# Patient Record
Sex: Female | Born: 1980 | Race: White | Hispanic: No | Marital: Married | Smoking: Current every day smoker
Health system: Southern US, Community
[De-identification: ages and names within clinical notes are randomized; demographics above are authoritative.]

## PROBLEM LIST (undated history)

## (undated) ENCOUNTER — Inpatient Hospital Stay (HOSPITAL_COMMUNITY): Payer: Self-pay

## (undated) DIAGNOSIS — Z9889 Other specified postprocedural states: Secondary | ICD-10-CM

## (undated) DIAGNOSIS — F329 Major depressive disorder, single episode, unspecified: Secondary | ICD-10-CM

## (undated) DIAGNOSIS — T4145XA Adverse effect of unspecified anesthetic, initial encounter: Secondary | ICD-10-CM

## (undated) DIAGNOSIS — M1990 Unspecified osteoarthritis, unspecified site: Secondary | ICD-10-CM

## (undated) DIAGNOSIS — R519 Headache, unspecified: Secondary | ICD-10-CM

## (undated) DIAGNOSIS — IMO0002 Reserved for concepts with insufficient information to code with codable children: Secondary | ICD-10-CM

## (undated) DIAGNOSIS — G8929 Other chronic pain: Secondary | ICD-10-CM

## (undated) DIAGNOSIS — R112 Nausea with vomiting, unspecified: Secondary | ICD-10-CM

## (undated) DIAGNOSIS — R51 Headache: Secondary | ICD-10-CM

## (undated) DIAGNOSIS — Z8619 Personal history of other infectious and parasitic diseases: Secondary | ICD-10-CM

## (undated) DIAGNOSIS — G5601 Carpal tunnel syndrome, right upper limb: Secondary | ICD-10-CM

## (undated) DIAGNOSIS — F32A Depression, unspecified: Secondary | ICD-10-CM

## (undated) DIAGNOSIS — T8859XA Other complications of anesthesia, initial encounter: Secondary | ICD-10-CM

## (undated) DIAGNOSIS — M549 Dorsalgia, unspecified: Secondary | ICD-10-CM

## (undated) DIAGNOSIS — F419 Anxiety disorder, unspecified: Secondary | ICD-10-CM

## (undated) DIAGNOSIS — R768 Other specified abnormal immunological findings in serum: Secondary | ICD-10-CM

## (undated) DIAGNOSIS — K219 Gastro-esophageal reflux disease without esophagitis: Secondary | ICD-10-CM

## (undated) DIAGNOSIS — R0600 Dyspnea, unspecified: Secondary | ICD-10-CM

## (undated) DIAGNOSIS — N3090 Cystitis, unspecified without hematuria: Secondary | ICD-10-CM

## (undated) DIAGNOSIS — M797 Fibromyalgia: Secondary | ICD-10-CM

## (undated) DIAGNOSIS — Z8739 Personal history of other diseases of the musculoskeletal system and connective tissue: Secondary | ICD-10-CM

## (undated) HISTORY — PX: WISDOM TOOTH EXTRACTION: SHX21

## (undated) HISTORY — DX: Personal history of other infectious and parasitic diseases: Z86.19

## (undated) HISTORY — DX: Adverse effect of unspecified anesthetic, initial encounter: T41.45XA

## (undated) HISTORY — DX: Other complications of anesthesia, initial encounter: T88.59XA

## (undated) HISTORY — DX: Reserved for concepts with insufficient information to code with codable children: IMO0002

## (undated) HISTORY — DX: Carpal tunnel syndrome, right upper limb: G56.01

## (undated) HISTORY — DX: Personal history of other diseases of the musculoskeletal system and connective tissue: Z87.39

---
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
## 2002-09-11 ENCOUNTER — Other Ambulatory Visit: Admission: RE | Admit: 2002-09-11 | Discharge: 2002-09-11 | Payer: Self-pay | Admitting: Obstetrics and Gynecology

## 2003-09-09 ENCOUNTER — Other Ambulatory Visit: Admission: RE | Admit: 2003-09-09 | Discharge: 2003-09-09 | Payer: Self-pay | Admitting: Obstetrics and Gynecology

## 2011-01-05 LAB — RPR: RPR: NONREACTIVE

## 2011-01-05 LAB — HIV ANTIBODY (ROUTINE TESTING W REFLEX): HIV: NONREACTIVE

## 2011-01-27 LAB — GC/CHLAMYDIA PROBE AMP, GENITAL
Chlamydia: NEGATIVE
Gonorrhea: NEGATIVE

## 2011-06-09 ENCOUNTER — Inpatient Hospital Stay (HOSPITAL_COMMUNITY): Payer: Federal, State, Local not specified - PPO

## 2011-06-09 ENCOUNTER — Encounter (HOSPITAL_COMMUNITY): Payer: Self-pay

## 2011-06-09 ENCOUNTER — Inpatient Hospital Stay (HOSPITAL_COMMUNITY)
Admission: AD | Admit: 2011-06-09 | Discharge: 2011-06-09 | Disposition: A | Discharge status: Home / Self Care | Payer: Federal, State, Local not specified - PPO | Source: Ambulatory Visit | Attending: Obstetrics and Gynecology | Admitting: Obstetrics and Gynecology

## 2011-06-09 DIAGNOSIS — K8020 Calculus of gallbladder without cholecystitis without obstruction: Secondary | ICD-10-CM

## 2011-06-09 DIAGNOSIS — R1011 Right upper quadrant pain: Secondary | ICD-10-CM | POA: Insufficient documentation

## 2011-06-09 DIAGNOSIS — O9989 Other specified diseases and conditions complicating pregnancy, childbirth and the puerperium: Secondary | ICD-10-CM | POA: Insufficient documentation

## 2011-06-09 HISTORY — DX: Anxiety disorder, unspecified: F41.9

## 2011-06-09 LAB — URINE MICROSCOPIC-ADD ON

## 2011-06-09 LAB — COMPREHENSIVE METABOLIC PANEL
ALT: 12 U/L (ref 0–35)
AST: 22 U/L (ref 0–37)
Albumin: 2.8 g/dL — ABNORMAL LOW (ref 3.5–5.2)
Alkaline Phosphatase: 114 U/L (ref 39–117)
BUN: 6 mg/dL (ref 6–23)
CO2: 24 mEq/L (ref 19–32)
Calcium: 9.1 mg/dL (ref 8.4–10.5)
Chloride: 99 mEq/L (ref 96–112)
Creatinine, Ser: 0.61 mg/dL (ref 0.50–1.10)
GFR calc Af Amer: >90 mL/min (ref >90)
GFR calc non Af Amer: >90 mL/min (ref >90)
Glucose, Bld: 83 mg/dL (ref 70–99)
Potassium: 3.6 mEq/L (ref 3.5–5.1)
Sodium: 133 mEq/L — ABNORMAL LOW (ref 135–145)
Total Bilirubin: 0.4 mg/dL (ref 0.3–1.2)
Total Protein: 6.5 g/dL (ref 6.0–8.3)

## 2011-06-09 LAB — URINALYSIS, ROUTINE W REFLEX MICROSCOPIC
Bilirubin Urine: NEGATIVE
Glucose, UA: NEGATIVE mg/dL
Hgb urine dipstick: NEGATIVE
Ketones, ur: NEGATIVE mg/dL
Nitrite: NEGATIVE
Protein, ur: NEGATIVE mg/dL
Specific Gravity, Urine: 1.015 (ref 1.005–1.030)
Urobilinogen, UA: 0.2 mg/dL (ref 0.0–1.0)
pH: 7.5 (ref 5.0–8.0)

## 2011-06-09 LAB — CBC
HCT: 33.3 % — ABNORMAL LOW (ref 36.0–46.0)
Hemoglobin: 10.9 g/dL — ABNORMAL LOW (ref 12.0–15.0)
MCH: 30.3 pg (ref 26.0–34.0)
MCHC: 32.7 g/dL (ref 30.0–36.0)
MCV: 92.5 fL (ref 78.0–100.0)
Platelets: 250 10*3/uL (ref 150–400)
RBC: 3.6 MIL/uL — ABNORMAL LOW (ref 3.87–5.11)
RDW: 12.5 % (ref 11.5–15.5)
WBC: 9.2 10*3/uL (ref 4.0–10.5)

## 2011-06-09 LAB — LIPASE, BLOOD: Lipase: 25 U/L (ref 11–59)

## 2011-06-09 LAB — AMYLASE: Amylase: 78 U/L (ref 0–105)

## 2011-06-09 MED ORDER — LACTATED RINGERS IV SOLN
INTRAVENOUS | Status: DC
  Administered 2011-06-09: 20:00:00 via INTRAVENOUS

## 2011-06-09 MED ORDER — PROMETHAZINE HCL 25 MG/ML IJ SOLN
12.5000 mg | INTRAMUSCULAR | Status: AC
  Administered 2011-06-09: 12.5 mg via INTRAVENOUS
  Filled 2011-06-09: qty 1

## 2011-06-09 MED ORDER — HYDROMORPHONE HCL 4 MG PO TABS: 4.0000 mg | ORAL_TABLET | ORAL | Status: AC | PRN

## 2011-06-09 MED ORDER — BUTORPHANOL TARTRATE 2 MG/ML IJ SOLN
1.0000 mg | INTRAMUSCULAR | Status: AC
  Administered 2011-06-09: 1 mg via INTRAVENOUS
  Filled 2011-06-09: qty 1

## 2011-06-09 NOTE — Progress Notes (Signed)
Pt reports constant sharp right upper quadrant pain since 3pm today and lower abdominal pressure. Denies vaginal bleeding. Reports positive fetal movement. Took medicine for headache this afternoon and headache has gotten better. Report intermittent blurry vision but no spots in vision.

## 2011-06-09 NOTE — Discharge Instructions (Signed)
Gallstones Gallstones are a form of gallbladder disease. The gallbladder is a small organ that helps you digest food.  HOME CARE  Only take medicine as told by your doctor.   Eat a low-fat diet.   Follow up as told.  GET HELP RIGHT AWAY IF:   Your pain gets worse.   You develop yellow skin or eyes (jaundice).   The pain moves to another part of your belly (abdomen) or back.   You have a temperature by mouth above 102 F (38.9 C), not controlled by medicine.   You feel sick to your stomach (nauseous) and throw up (vomit).  MAKE SURE YOU:   Understand these instructions.   Will watch your condition.   Will get help right away if you are not doing well or get worse.  Document Released: 09/07/2007 Document Revised: 03/10/2011 Document Reviewed: 02/24/2009 [employer] Patient Information 2012 [employer]. 

## 2011-06-09 NOTE — ED Provider Notes (Signed)
Chief Complaint:  RUQ pain  HPI  Cynthia Brooks is  31 y.o. [id number] at [redacted]w[redacted]d presents with RUQ pain starting today.  She reports some mild nausea r/t not eating since noon today but reports the pain is worsening since arrival in MAU.  She reports good fetal movement and denies LOF, vaginal bleeding, uterine contractions, dizziness, h/a, visual disturbances, urinary symptoms, or fever/chills.    Obstetrical/Gynecological History: OB History    Grav Para Term Preterm Abortions TAB SAB Ect Mult Living   4 2 2  0 1 0 1 0 0 2      Past Medical History: Past Medical History  Diagnosis Date  . Anxiety     Past Surgical History: Past Surgical History  Procedure Date  . Appendectomy 1989    Family History: Family History  Problem Relation Age of Onset  . Anesthesia problems Neg Hx     Social History: History  Substance Use Topics  . Smoking status: Never Smoker   . Smokeless tobacco: Not on file  . Alcohol Use: No    Allergies:  Allergies  Allergen Reactions  . Bupivacaine Other (See Comments)    Hypotension; with epidural  . Fentanyl Other (See Comments)    Hypotension; with epidural  . Percocet (Oxycodone-Acetaminophen) Nausea And Vomiting    Meds:  Prescriptions prior to admission  Medication Sig Dispense Refill  . citalopram (CELEXA) 20 MG tablet Take 20 mg by mouth daily.      Marland Kitchen HYDROcodone-acetaminophen (NORCO) 7.5-325 MG per tablet Take 1.5 tablets by mouth every 4 (four) hours as needed. Head aches      . Prenatal Vit-Fe Fumarate-FA (PRENATAL MULTIVITAMIN) TABS Take 1 tablet by mouth at bedtime.      . Simethicone (GAS-X PO) Take 1 tablet by mouth daily as needed. gas        Review of Systems See HPI   Physical Exam  Blood pressure 113/68, pulse 92, resp. rate 18. GENERAL: Well-developed, well-nourished female in no acute distress.  LUNGS: Clear to auscultation bilaterally.  HEART: Regular rate and rhythm. ABDOMEN: Soft, nontender, nondistended, gravid.    EXTREMITIES: Nontender, no edema, 2+ distal pulses. Pelvic exam deferred  Neg CVA tenderness Neg rebound tenderness Neg guarding Neg McBurney's Point tenderness  FHT:  Baseline rate 125 bpm   Variability moderate  Accelerations present   Decelerations none Contractions: None   Labs: Recent Results (from the past 24 hour(s))  URINALYSIS, ROUTINE W REFLEX MICROSCOPIC   Collection Time   06/09/11  6:14 PM      Component Value Range   Color, Urine YELLOW  YELLOW    APPearance HAZY (*) CLEAR    Specific Gravity, Urine 1.015  1.005 - 1.030    pH 7.5  5.0 - 8.0    Glucose, UA NEGATIVE  NEGATIVE (mg/dL)   Hgb urine dipstick NEGATIVE  NEGATIVE    Bilirubin Urine NEGATIVE  NEGATIVE    Ketones, ur NEGATIVE  NEGATIVE (mg/dL)   Protein, ur NEGATIVE  NEGATIVE (mg/dL)   Urobilinogen, UA 0.2  0.0 - 1.0 (mg/dL)   Nitrite NEGATIVE  NEGATIVE    Leukocytes, UA SMALL (*) NEGATIVE   URINE MICROSCOPIC-ADD ON   Collection Time   06/09/11  6:14 PM      Component Value Range   Squamous Epithelial / LPF MANY (*) RARE    WBC, UA 11-20  <3 (WBC/hpf)   Bacteria, UA FEW (*) RARE   COMPREHENSIVE METABOLIC PANEL   Collection Time  06/09/11  7:02 PM      Component Value Range   Sodium 133 (*) 135 - 145 (mEq/L)   Potassium 3.6  3.5 - 5.1 (mEq/L)   Chloride 99  96 - 112 (mEq/L)   CO2 24  19 - 32 (mEq/L)   Glucose, Bld 83  70 - 99 (mg/dL)   BUN 6  6 - 23 (mg/dL)   Creatinine, Ser 1.61  0.50 - 1.10 (mg/dL)   Calcium 9.1  8.4 - 09.6 (mg/dL)   Total Protein 6.5  6.0 - 8.3 (g/dL)   Albumin 2.8 (*) 3.5 - 5.2 (g/dL)   AST 22  0 - 37 (U/L)   ALT 12  0 - 35 (U/L)   Alkaline Phosphatase 114  39 - 117 (U/L)   Total Bilirubin 0.4  0.3 - 1.2 (mg/dL)   GFR calc non Af Amer >90  >90 (mL/min)   GFR calc Af Amer >90  >90 (mL/min)  CBC   Collection Time   06/09/11  7:02 PM      Component Value Range   WBC 9.2  4.0 - 10.5 (K/uL)   RBC 3.60 (*) 3.87 - 5.11 (MIL/uL)   Hemoglobin 10.9 (*) 12.0 - 15.0 (g/dL)   HCT  04.5 (*) 40.9 - 46.0 (%)   MCV 92.5  78.0 - 100.0 (fL)   MCH 30.3  26.0 - 34.0 (pg)   MCHC 32.7  30.0 - 36.0 (g/dL)   RDW 81.1  91.4 - 78.2 (%)   Platelets 250  150 - 400 (K/uL)   Imaging Studies:    Assessment/Plan: Called Dr Marcelle Overlie and reviewed pt presentation Plan for IV fluids, medications and abdominal and OB U/S  IV LR IV phenergan 12.5 mg IV Stadol 1 mg Abdominal U/S for RUQ pain OB limited U/S of placenta CMP, CBC, U/A, amylase, lipase  LEFTWICH-KIRBY, LISA 3/7/20138:11 PM  Care assumed by Henrietta Hoover, PA.   I have assumed care of this pt from Sharen Counter, CNM.  US Abdomen Complete  06/09/2011  *RADIOLOGY REPORT*  Clinical Data:  Abdominal pain.  COMPLETE ABDOMINAL ULTRASOUND  Comparison:  None.  Findings:  Gallbladder:  There is extensive sludge in the gallbladder and there appear to be a few tiny stones.  Negative sonographic Murphy's sign.  Gallbladder wall is not thickened.  Common bile duct:  Normal.  3.3 mm in diameter.  Liver:  Normal.  IVC:  Normal.  Pancreas:  Normal.  Spleen:  Normal.  8.0 cm in length.  Right Kidney:  Normal.  10.1 cm in length.  Left Kidney:  Normal.  9.8 cm in length.  Abdominal aorta:  No aneurysm identified.  IMPRESSION: Sludge and possible tiny stones in the gallbladder.  Otherwise normal exam.  Original Report Authenticated By: Gwynn Burly, M.D.    Discussed pt with Dr. Marcelle Overlie. Will give Rx for few dilaudid and have pt f/u in office within 1wk.  A/P: Cholelithiasis and sludge: discussed with pt at length. Discussed proper diet. Will give few dilaudid for prn pain. She will f/u with Dr. Dennie Bible office within 1wk. Discussed diet, activity, risks, and precautions.  Clinton Gallant. Omran Keelin III, DrHSc, MPAS, PA-C   Henrietta Hoover, PA 06/09/11 2051  Henrietta Hoover, [place] 06/09/11 2115

## 2011-06-10 ENCOUNTER — Inpatient Hospital Stay (HOSPITAL_COMMUNITY)
Admission: AD | Admit: 2011-06-10 | Discharge: 2011-06-10 | DRG: 886 | Disposition: A | Discharge status: Home / Self Care | Payer: Federal, State, Local not specified - PPO | Attending: Obstetrics and Gynecology | Admitting: Obstetrics and Gynecology

## 2011-06-10 ENCOUNTER — Encounter (HOSPITAL_COMMUNITY): Payer: Self-pay | Admitting: *Deleted

## 2011-06-10 DIAGNOSIS — O9989 Other specified diseases and conditions complicating pregnancy, childbirth and the puerperium: Principal | ICD-10-CM | POA: Diagnosis present

## 2011-06-10 DIAGNOSIS — K8020 Calculus of gallbladder without cholecystitis without obstruction: Secondary | ICD-10-CM | POA: Diagnosis present

## 2011-06-10 DIAGNOSIS — R1011 Right upper quadrant pain: Secondary | ICD-10-CM | POA: Diagnosis present

## 2011-06-10 HISTORY — DX: Other specified abnormal immunological findings in serum: R76.8

## 2011-06-10 LAB — COMPREHENSIVE METABOLIC PANEL
ALT: 20 U/L (ref 0–35)
AST: 36 U/L (ref 0–37)
Albumin: 2.5 g/dL — ABNORMAL LOW (ref 3.5–5.2)
Alkaline Phosphatase: 121 U/L — ABNORMAL HIGH (ref 39–117)
BUN: 6 mg/dL (ref 6–23)
CO2: 25 mEq/L (ref 19–32)
Calcium: 8.3 mg/dL — ABNORMAL LOW (ref 8.4–10.5)
Chloride: 104 mEq/L (ref 96–112)
Creatinine, Ser: 0.59 mg/dL (ref 0.50–1.10)
GFR calc Af Amer: >90 mL/min (ref >90)
GFR calc non Af Amer: >90 mL/min (ref >90)
Glucose, Bld: 96 mg/dL (ref 70–99)
Potassium: 4.1 mEq/L (ref 3.5–5.1)
Sodium: 136 mEq/L (ref 135–145)
Total Bilirubin: 1.1 mg/dL (ref 0.3–1.2)
Total Protein: 5.9 g/dL — ABNORMAL LOW (ref 6.0–8.3)

## 2011-06-10 LAB — CBC
HCT: 29.9 % — ABNORMAL LOW (ref 36.0–46.0)
Hemoglobin: 10 g/dL — ABNORMAL LOW (ref 12.0–15.0)
MCH: 31 pg (ref 26.0–34.0)
MCHC: 33.4 g/dL (ref 30.0–36.0)
MCV: 92.6 fL (ref 78.0–100.0)
Platelets: 221 10*3/uL (ref 150–400)
RBC: 3.23 MIL/uL — ABNORMAL LOW (ref 3.87–5.11)
RDW: 12.6 % (ref 11.5–15.5)
WBC: 7.4 10*3/uL (ref 4.0–10.5)

## 2011-06-10 LAB — HEPATITIS B SURFACE ANTIGEN: Hepatitis B Surface Ag: NEGATIVE

## 2011-06-10 LAB — ABO/RH: RH Type: POSITIVE

## 2011-06-10 LAB — ANTIBODY SCREEN: Antibody Screen: NEGATIVE

## 2011-06-10 LAB — HIV ANTIBODY (ROUTINE TESTING W REFLEX): HIV: NONREACTIVE

## 2011-06-10 LAB — RPR: RPR: NONREACTIVE

## 2011-06-10 LAB — RUBELLA ANTIBODY, IGM: Rubella: IMMUNE

## 2011-06-10 LAB — GC/CHLAMYDIA PROBE AMP, GENITAL
Chlamydia: NEGATIVE
Gonorrhea: NEGATIVE

## 2011-06-10 MED ORDER — ZOLPIDEM TARTRATE 10 MG PO TABS: 10.0000 mg | ORAL_TABLET | Freq: Every evening | ORAL | Status: DC | PRN

## 2011-06-10 MED ORDER — DOCUSATE SODIUM 100 MG PO CAPS
100.0000 mg | ORAL_CAPSULE | Freq: Every day | ORAL | Status: DC
  Administered 2011-06-10: 100 mg via ORAL
  Filled 2011-06-10: qty 1

## 2011-06-10 MED ORDER — CALCIUM CARBONATE ANTACID 500 MG PO CHEW: 2.0000 | CHEWABLE_TABLET | ORAL | Status: DC | PRN

## 2011-06-10 MED ORDER — ONDANSETRON HCL 4 MG/2ML IJ SOLN: 4.0000 mg | Freq: Four times a day (QID) | INTRAMUSCULAR | Status: DC | PRN

## 2011-06-10 MED ORDER — ACETAMINOPHEN 325 MG PO TABS: 650.0000 mg | ORAL_TABLET | ORAL | Status: DC | PRN

## 2011-06-10 MED ORDER — TRAMADOL HCL 50 MG PO TABS: 50.0000 mg | ORAL_TABLET | Freq: Three times a day (TID) | ORAL | Status: DC | PRN

## 2011-06-10 MED ORDER — DIPHENHYDRAMINE HCL 50 MG/ML IJ SOLN: 12.5000 mg | Freq: Four times a day (QID) | INTRAMUSCULAR | Status: DC | PRN

## 2011-06-10 MED ORDER — NALOXONE HCL 0.4 MG/ML IJ SOLN: 0.4000 mg | INTRAMUSCULAR | Status: DC | PRN

## 2011-06-10 MED ORDER — PRENATAL MULTIVITAMIN CH
1.0000 | ORAL_TABLET | Freq: Every day | ORAL | Status: DC
  Filled 2011-06-10: qty 1

## 2011-06-10 MED ORDER — HYDROCODONE-ACETAMINOPHEN 5-325 MG PO TABS: 1.0000 | ORAL_TABLET | ORAL | Status: DC | PRN

## 2011-06-10 MED ORDER — SODIUM CHLORIDE 0.9 % IJ SOLN: 9.0000 mL | INTRAMUSCULAR | Status: DC | PRN

## 2011-06-10 MED ORDER — LACTATED RINGERS IV SOLN
INTRAVENOUS | Status: DC
  Administered 2011-06-10: 01:00:00 via INTRAVENOUS

## 2011-06-10 MED ORDER — DIPHENHYDRAMINE HCL 12.5 MG/5ML PO ELIX
12.5000 mg | ORAL_SOLUTION | Freq: Four times a day (QID) | ORAL | Status: DC | PRN
  Filled 2011-06-10: qty 5

## 2011-06-10 MED ORDER — MORPHINE SULFATE (PF) 1 MG/ML IV SOLN
INTRAVENOUS | Status: DC
  Administered 2011-06-10: 02:00:00 via INTRAVENOUS
  Administered 2011-06-10: 4.5 mL via INTRAVENOUS
  Filled 2011-06-10: qty 25

## 2011-06-10 NOTE — Progress Notes (Signed)
Patient ID: Cynthia Brooks, female   DOB: 12/17/1980, 31 y.o.   MRN: [id number] Pt has tolerated oral today without problems VSSAF FHR +  DC home with bland diet to fu in office as scheduled DL

## 2011-06-10 NOTE — Progress Notes (Signed)
  Nutrition Dx: Food and nutrition-related knowledge deficit r/t no previous education aeb recently diagnosed with gallstones.   Nutrition education consult for Fat modified Diet completed.   Handout given to patient.  5 days of low fat menus given to pt. Concepts of diet for gall stones reviewed.  Questions answered.  Patient verbalizes understanding.  Fat modiifed diet may not completely eliminate symptoms of gallstones. Pain can be triggered by eating foods not high in fat.  Have counseled pt to also eliminate foods that are gas producing, spicy and have strong flavors. Even if patient follows all of these modifications, she may still experience episodes of pain.

## 2011-06-10 NOTE — Progress Notes (Signed)
Pt complains of nausea after eating; refuses med accepted gingerale

## 2011-06-10 NOTE — Discharge Instructions (Signed)
Gallstones Gallstones are a form of gallbladder disease. The gallbladder is a small organ that helps you digest food.  HOME CARE  Only take medicine as told by your doctor.   Eat a low-fat diet.   Follow up as told.  GET HELP RIGHT AWAY IF:   Your pain gets worse.   You develop yellow skin or eyes (jaundice).   The pain moves to another part of your belly (abdomen) or back.   You have a temperature by mouth above 102 F (38.9 C), not controlled by medicine.   You feel sick to your stomach (nauseous) and throw up (vomit).  MAKE SURE YOU:   Understand these instructions.   Will watch your condition.   Will get help right away if you are not doing well or get worse.  Document Released: 09/07/2007 Document Revised: 03/10/2011 Document Reviewed: 02/24/2009 [employer] Patient Information 2012 [employer]. 

## 2011-06-10 NOTE — Progress Notes (Signed)
UR Chart review completed.  

## 2011-06-10 NOTE — Progress Notes (Signed)
PT  HAS GALL STONES- WAS IN MAU   AT 930PM -  GAVE PAIN MED- ALSO GAVE RX-   TOOK HYDROMORPHONE  - 1 TAB.   PT VOMITED  AT 2330.     ATE   AT HOME AT 2200-  [place], CHEESE , BREAD

## 2011-06-10 NOTE — Progress Notes (Signed)
Pt reports no nausea or pain GFM VSSAF FHR 140s Abd Gravid nt  Biliary colic at 30 weeks Will get nutritional consult and advance diet Oral pain meds,  If does well, consider d/c DL

## 2011-06-10 NOTE — ED Provider Notes (Signed)
History   Pt presents today c/o worsening RUQ pain since being dc'd from the MAU earlier tonight. She was diagnosed with gallstones. When she went home, she ate a [place] and cheese sandwich and then her pain became worse. She denies lower abd pain, vag dc, bleeding, or any other sx at this time.  No chief complaint on file.  HPI  OB History    Grav Para Term Preterm Abortions TAB SAB Ect Mult Living   4 2 2  0 1 0 1 0 0 2      Past Medical History  Diagnosis Date  . Anxiety     Past Surgical History  Procedure Date  . Appendectomy 1989    Family History  Problem Relation Age of Onset  . Anesthesia problems Neg Hx     History  Substance Use Topics  . Smoking status: Never Smoker   . Smokeless tobacco: Not on file  . Alcohol Use: No    Allergies:  Allergies  Allergen Reactions  . Bupivacaine Other (See Comments)    Hypotension; with epidural  . Fentanyl Other (See Comments)    Hypotension; with epidural  . Percocet (Oxycodone-Acetaminophen) Nausea And Vomiting    Prescriptions prior to admission  Medication Sig Dispense Refill  . citalopram (CELEXA) 20 MG tablet Take 20 mg by mouth daily.      Marland Kitchen HYDROcodone-acetaminophen (NORCO) 7.5-325 MG per tablet Take 1.5 tablets by mouth every 4 (four) hours as needed. Head aches      . HYDROmorphone (DILAUDID) 4 MG tablet Take 1 tablet (4 mg total) by mouth every 4 (four) hours as needed for pain.  10 tablet  0  . Prenatal Vit-Fe Fumarate-FA (PRENATAL MULTIVITAMIN) TABS Take 1 tablet by mouth at bedtime.      . Simethicone (GAS-X PO) Take 1 tablet by mouth daily as needed. gas        Review of Systems  Constitutional: Negative for fever and chills.  Eyes: Negative for blurred vision and double vision.  Respiratory: Negative for cough, hemoptysis, sputum production, shortness of breath and wheezing.   Cardiovascular: Negative for chest pain and palpitations.  Gastrointestinal: Positive for nausea, vomiting and abdominal  pain. Negative for diarrhea and constipation.  Genitourinary: Negative for dysuria, urgency, frequency and hematuria.  Neurological: Negative for dizziness and headaches.  Psychiatric/Behavioral: Negative for depression and suicidal ideas.   Physical Exam   Blood pressure 102/69, pulse 101, temperature 97 F (36.1 C), temperature source Oral, resp. rate 20, height 5\' 4"  (1.626 m), weight 190 lb 8 oz (86.41 kg).  Physical Exam  Nursing note and vitals reviewed. Constitutional: She is oriented to person, place, and time. She appears well-developed and well-nourished. No distress.  HENT:  Head: Normocephalic and atraumatic.  Eyes: EOM are normal. Pupils are equal, round, and reactive to light.  GI: Soft. She exhibits no distension. There is tenderness. There is no rebound and no guarding.  Neurological: She is alert and oriented to person, place, and time.  Skin: Skin is warm and dry. She is not diaphoretic.  Psychiatric: She has a normal mood and affect. Her behavior is normal. Judgment and thought content normal.    MAU Course  Procedures  Results for orders placed during the hospital encounter of 06/09/11 (from the past 24 hour(s))  URINALYSIS, ROUTINE W REFLEX MICROSCOPIC     Status: Abnormal   Collection Time   06/09/11  6:14 PM      Component Value Range   Color, Urine  YELLOW  YELLOW    APPearance HAZY (*) CLEAR    Specific Gravity, Urine 1.015  1.005 - 1.030    pH 7.5  5.0 - 8.0    Glucose, UA NEGATIVE  NEGATIVE (mg/dL)   Hgb urine dipstick NEGATIVE  NEGATIVE    Bilirubin Urine NEGATIVE  NEGATIVE    Ketones, ur NEGATIVE  NEGATIVE (mg/dL)   Protein, ur NEGATIVE  NEGATIVE (mg/dL)   Urobilinogen, UA 0.2  0.0 - 1.0 (mg/dL)   Nitrite NEGATIVE  NEGATIVE    Leukocytes, UA SMALL (*) NEGATIVE   URINE MICROSCOPIC-ADD ON     Status: Abnormal   Collection Time   06/09/11  6:14 PM      Component Value Range   Squamous Epithelial / LPF MANY (*) RARE    WBC, UA 11-20  <3 (WBC/hpf)    Bacteria, UA FEW (*) RARE   COMPREHENSIVE METABOLIC PANEL     Status: Abnormal   Collection Time   06/09/11  7:02 PM      Component Value Range   Sodium 133 (*) 135 - 145 (mEq/L)   Potassium 3.6  3.5 - 5.1 (mEq/L)   Chloride 99  96 - 112 (mEq/L)   CO2 24  19 - 32 (mEq/L)   Glucose, Bld 83  70 - 99 (mg/dL)   BUN 6  6 - 23 (mg/dL)   Creatinine, Ser 1.61  0.50 - 1.10 (mg/dL)   Calcium 9.1  8.4 - 09.6 (mg/dL)   Total Protein 6.5  6.0 - 8.3 (g/dL)   Albumin 2.8 (*) 3.5 - 5.2 (g/dL)   AST 22  0 - 37 (U/L)   ALT 12  0 - 35 (U/L)   Alkaline Phosphatase 114  39 - 117 (U/L)   Total Bilirubin 0.4  0.3 - 1.2 (mg/dL)   GFR calc non Af Amer >90  >90 (mL/min)   GFR calc Af Amer >90  >90 (mL/min)  CBC     Status: Abnormal   Collection Time   06/09/11  7:02 PM      Component Value Range   WBC 9.2  4.0 - 10.5 (K/uL)   RBC 3.60 (*) 3.87 - 5.11 (MIL/uL)   Hemoglobin 10.9 (*) 12.0 - 15.0 (g/dL)   HCT 04.5 (*) 40.9 - 46.0 (%)   MCV 92.5  78.0 - 100.0 (fL)   MCH 30.3  26.0 - 34.0 (pg)   MCHC 32.7  30.0 - 36.0 (g/dL)   RDW 81.1  91.4 - 78.2 (%)   Platelets 250  150 - 400 (K/uL)  AMYLASE     Status: Normal   Collection Time   06/09/11  7:02 PM      Component Value Range   Amylase 78  0 - 105 (U/L)  LIPASE, BLOOD     Status: Normal   Collection Time   06/09/11  7:02 PM      Component Value Range   Lipase 25  11 - 59 (U/L)   US Abdomen Complete  06/09/2011  *RADIOLOGY REPORT*  Clinical Data:  Abdominal pain.  COMPLETE ABDOMINAL ULTRASOUND  Comparison:  None.  Findings:  Gallbladder:  There is extensive sludge in the gallbladder and there appear to be a few tiny stones.  Negative sonographic Murphy's sign.  Gallbladder wall is not thickened.  Common bile duct:  Normal.  3.3 mm in diameter.  Liver:  Normal.  IVC:  Normal.  Pancreas:  Normal.  Spleen:  Normal.  8.0 cm in length.  Right  Kidney:  Normal.  10.1 cm in length.  Left Kidney:  Normal.  9.8 cm in length.  Abdominal aorta:  No aneurysm identified.   IMPRESSION: Sludge and possible tiny stones in the gallbladder.  Otherwise normal exam.  Original Report Authenticated By: Gwynn Burly, M.D.   Discussed pt with Dr. Marcelle Overlie. Will admit for pain management.  Assessment and Plan  Admit for pain management.  Clinton Gallant. Aden Sek III, DrHSc, MPAS, PA-C  06/10/2011, 12:40 AM   Henrietta Hoover, PA 06/10/11 [phone]

## 2011-06-10 NOTE — Discharge Summary (Signed)
Physician Discharge Summary  Patient ID: Cynthia Brooks MRN: [id number] DOB/AGE: 1980-06-10 31 y.o.  Admit date: 06/10/2011 Discharge date: 06/10/2011  Admission Diagnoses:Pregnancy and biliary colic   Discharge Diagnoses: same Active Problems:  * No active hospital problems. *    Discharged Condition: good  Hospital Course: Admiited for IVF and pain control of biliary colic.  Pt responded to dietary changes and was discharged home on bland diet  Consults: None  Significant Diagnostic Studies: labs:   Treatments: IV hydration and analgesia: Vicodin and Dilaudid  Discharge Exam: Blood pressure 116/70, pulse 80, temperature 98.1 F (36.7 C), temperature source Oral, resp. rate 20, height 5\' 4"  (1.626 m), weight 86.183 kg (190 lb), SpO2 100.00%. General appearance: alert, cooperative and no distress  Disposition: 01-Home or Self Care  Discharge Orders    Future Orders Please Complete By Expires   Diet general      Scheduling Instructions:   Bland fat free diet as discussed   Increase activity slowly      Call MD for:  temperature >100.4      Call MD for:  persistant nausea and vomiting      Call MD for:  severe uncontrolled pain      Call MD for:  redness, tenderness, or signs of infection (pain, swelling, redness, odor or green/yellow discharge around incision site)      Call MD for:  difficulty breathing, headache or visual disturbances      HIV antibody      Comments:   This external order was created through the Results Console.   GC/chlamydia probe amp, genital      Comments:   This external order was created through the Results Console.   Rubella antibody, IgM      Comments:   This external order was created through the Results Console.   Hepatitis B surface antigen      Comments:   This external order was created through the Results Console.   RPR      Comments:   This external order was created through the Results Console.   HIV antibody      Comments:   This  external order was created through the Results Console.   GC/chlamydia probe amp, genital      Comments:   This external order was created through the Results Console.   RPR      Comments:   This external order was created through the Results Console.   Antibody screen      Comments:   This external order was created through the Results Console.   ABO/Rh      Comments:   This external order was created through the Results Console.     Medication List  As of 06/10/2011  4:00 PM   TAKE these medications         citalopram 20 MG tablet   Commonly known as: CELEXA   Take 20 mg by mouth daily.      GAS-X PO   Take 1 tablet by mouth daily as needed. gas      HYDROcodone-acetaminophen 7.5-325 MG per tablet   Commonly known as: NORCO   Take 1.5 tablets by mouth every 4 (four) hours as needed. Head aches      HYDROmorphone 4 MG tablet   Commonly known as: DILAUDID   Take 1 tablet (4 mg total) by mouth every 4 (four) hours as needed for pain.      prenatal multivitamin Tabs  Take 1 tablet by mouth at bedtime.             Signed: Treyveon Mochizuki C 06/10/2011, 4:00 PM

## 2011-07-26 ENCOUNTER — Encounter (HOSPITAL_COMMUNITY): Payer: Self-pay | Admitting: *Deleted

## 2011-07-26 ENCOUNTER — Inpatient Hospital Stay (HOSPITAL_COMMUNITY)
Admission: AD | Admit: 2011-07-26 | Discharge: 2011-07-26 | Disposition: A | Discharge status: Home / Self Care | Payer: Federal, State, Local not specified - PPO | Source: Ambulatory Visit | Attending: Obstetrics and Gynecology | Admitting: Obstetrics and Gynecology

## 2011-07-26 DIAGNOSIS — O4700 False labor before 37 completed weeks of gestation, unspecified trimester: Secondary | ICD-10-CM | POA: Insufficient documentation

## 2011-07-26 LAB — OB RESULTS CONSOLE GBS: GBS: NEGATIVE

## 2011-07-26 NOTE — MAU Note (Signed)
Pt states she has been having contractions every since about 1600.

## 2011-07-26 NOTE — Progress Notes (Signed)
Pt removed self from monitor. Pt informed prior that she needed to be monitor for another .

## 2011-07-26 NOTE — MAU Note (Signed)
Patient states she was 4/80 in the office this am. State contractions every 5 minutes. Denies any leaking or bleeding and reports good fetal movement.

## 2011-07-26 NOTE — Progress Notes (Signed)
Pt also states she has gall bladder problems

## 2011-07-26 NOTE — Discharge Instructions (Signed)
Pregnancy - Third Trimester The third trimester of pregnancy (the last 3 months) is a period of the most rapid growth for you and your baby. The baby approaches a length of 20 inches and a weight of 6 to 10 pounds. The baby is adding on fat and getting ready for life outside your body. While inside, babies have periods of sleeping and waking, suck their thumbs, and hiccups. You can often feel small contractions of the uterus. This is false labor. It is also called Braxton-Hicks contractions. This is like a practice for labor. The usual problems in this stage of pregnancy include more difficulty breathing, swelling of the hands and feet from water retention, and having to urinate more often because of the uterus and baby pressing on your bladder.  PRENATAL EXAMS  Blood work may continue to be done during prenatal exams. These tests are done to check on your health and the probable health of your baby. Blood work is used to follow your blood levels (hemoglobin). Anemia (low hemoglobin) is common during pregnancy. Iron and vitamins are given to help prevent this. You may also continue to be checked for diabetes. Some of the past blood tests may be done again.   The size of the uterus is measured during each visit. This makes sure your baby is growing properly according to your pregnancy dates.   Your blood pressure is checked every prenatal visit. This is to make sure you are not getting toxemia.   Your urine is checked every prenatal visit for infection, diabetes and protein.   Your weight is checked at each visit. This is done to make sure gains are happening at the suggested rate and that you and your baby are growing normally.   Sometimes, an ultrasound is performed to confirm the position and the proper growth and development of the baby. This is a test done that bounces harmless sound waves off the baby so your caregiver can more accurately determine due dates.   Discuss the type of pain  medication and anesthesia you will have during your labor and delivery.   Discuss the possibility and anesthesia if a Cesarean Section might be necessary.   Inform your caregiver if there is any mental or physical violence at home.  Sometimes, a specialized non-stress test, contraction stress test and biophysical profile are done to make sure the baby is not having a problem. Checking the amniotic fluid surrounding the baby is called an amniocentesis. The amniotic fluid is removed by sticking a needle into the belly (abdomen). This is sometimes done near the end of pregnancy if an early delivery is required. In this case, it is done to help make sure the baby's lungs are mature enough for the baby to live outside of the womb. If the lungs are not mature and it is unsafe to deliver the baby, an injection of cortisone medication is given to the mother 1 to 2 days before the delivery. This helps the baby's lungs mature and makes it safer to deliver the baby. CHANGES OCCURING IN THE THIRD TRIMESTER OF PREGNANCY Your body goes through many changes during pregnancy. They vary from person to person. Talk to your caregiver about changes you notice and are concerned about.  During the last trimester, you have probably had an increase in your appetite. It is normal to have cravings for certain foods. This varies from person to person and pregnancy to pregnancy.   You may begin to get stretch marks on your hips,   abdomen, and breasts. These are normal changes in the body during pregnancy. There are no exercises or medications to take which prevent this change.   Constipation may be treated with a stool softener or adding bulk to your diet. Drinking lots of fluids, fiber in vegetables, fruits, and whole grains are helpful.   Exercising is also helpful. If you have been very active up until your pregnancy, most of these activities can be continued during your pregnancy. If you have been less active, it is helpful  to start an exercise program such as walking. Consult your caregiver before starting exercise programs.   Avoid all smoking, alcohol, un-prescribed drugs, herbs and "street drugs" during your pregnancy. These chemicals affect the formation and growth of the baby. Avoid chemicals throughout the pregnancy to ensure the delivery of a healthy infant.   Backache, varicose veins and hemorrhoids may develop or get worse.   You will tire more easily in the third trimester, which is normal.   The baby's movements may be stronger and more often.   You may become short of breath easily.   Your belly button may stick out.   A yellow discharge may leak from your breasts called colostrum.   You may have a bloody mucus discharge. This usually occurs a few days to a week before labor begins.  HOME CARE INSTRUCTIONS   Keep your caregiver's appointments. Follow your caregiver's instructions regarding medication use, exercise, and diet.   During pregnancy, you are providing food for you and your baby. Continue to eat regular, well-balanced meals. Choose foods such as meat, fish, milk and other low fat dairy products, vegetables, fruits, and whole-grain breads and cereals. Your caregiver will tell you of the ideal weight gain.   A physical sexual relationship may be continued throughout pregnancy if there are no other problems such as early (premature) leaking of amniotic fluid from the membranes, vaginal bleeding, or belly (abdominal) pain.   Exercise regularly if there are no restrictions. Check with your caregiver if you are unsure of the safety of your exercises. Greater weight gain will occur in the last 2 trimesters of pregnancy. Exercising helps:   Control your weight.   Get you in shape for labor and delivery.   You lose weight after you deliver.   Rest a lot with legs elevated, or as needed for leg cramps or low back pain.   Wear a good support or jogging bra for breast tenderness during  pregnancy. This may help if worn during sleep. Pads or tissues may be used in the bra if you are leaking colostrum.   Do not use hot tubs, steam rooms, or saunas.   Wear your seat belt when driving. This protects you and your baby if you are in an accident.   Avoid raw meat, cat litter boxes and soil used by cats. These carry germs that can cause birth defects in the baby.   It is easier to loose urine during pregnancy. Tightening up and strengthening the pelvic muscles will help with this problem. You can practice stopping your urination while you are going to the bathroom. These are the same muscles you need to strengthen. It is also the muscles you would use if you were trying to stop from passing gas. You can practice tightening these muscles up 10 times a set and repeating this about 3 times per day. Once you know what muscles to tighten up, do not perform these exercises during urination. It is more likely   to cause an infection by backing up the urine.   Ask for help if you have financial, counseling or nutritional needs during pregnancy. Your caregiver will be able to offer counseling for these needs as well as refer you for other special needs.   Make a list of emergency phone numbers and have them available.   Plan on getting help from family or friends when you go home from the hospital.   Make a trial run to the hospital.   Take prenatal classes with the father to understand, practice and ask questions about the labor and delivery.   Prepare the baby's room/nursery.   Do not travel out of the city unless it is absolutely necessary and with the advice of your caregiver.   Wear only low or no heal shoes to have better balance and prevent falling.  MEDICATIONS AND DRUG USE IN PREGNANCY  Take prenatal vitamins as directed. The vitamin should contain 1 milligram of folic acid. Keep all vitamins out of reach of children. Only a couple vitamins or tablets containing iron may be fatal  to a baby or young child when ingested.   Avoid use of all medications, including herbs, over-the-counter medications, not prescribed or suggested by your caregiver. Only take over-the-counter or prescription medicines for pain, discomfort, or fever as directed by your caregiver. Do not use aspirin, ibuprofen (Motrin, Advil, Nuprin) or naproxen (Aleve) unless OK'd by your caregiver.   Let your caregiver also know about herbs you may be using.   Alcohol is related to a number of birth defects. This includes fetal alcohol syndrome. All alcohol, in any form, should be avoided completely. Smoking will cause low birth rate and premature babies.   Street/illegal drugs are very harmful to the baby. They are absolutely forbidden. A baby born to an addicted mother will be addicted at birth. The baby will go through the same withdrawal an adult does.  SEEK MEDICAL CARE IF: You have any concerns or worries during your pregnancy. It is better to call with your questions if you feel they cannot wait, rather than worry about them. DECISIONS ABOUT CIRCUMCISION You may or may not know the sex of your baby. If you know your baby is a boy, it may be time to think about circumcision. Circumcision is the removal of the foreskin of the penis. This is the skin that covers the sensitive end of the penis. There is no proven medical need for this. Often this decision is made on what is popular at the time or based upon religious beliefs and social issues. You can discuss these issues with your caregiver or pediatrician. SEEK IMMEDIATE MEDICAL CARE IF:   An unexplained oral temperature above 102 F (38.9 C) develops, or as your caregiver suggests.   You have leaking of fluid from the vagina (birth canal). If leaking membranes are suspected, take your temperature and tell your caregiver of this when you call.   There is vaginal spotting, bleeding or passing clots. Tell your caregiver of the amount and how many pads are  used.   You develop a bad smelling vaginal discharge with a change in the color from clear to white.   You develop vomiting that lasts more than 24 hours.   You develop chills or fever.   You develop shortness of breath.   You develop burning on urination.   You loose more than 2 pounds of weight or gain more than 2 pounds of weight or as suggested by your   caregiver.   You notice sudden swelling of your face, hands, and feet or legs.   You develop belly (abdominal) pain. Round ligament discomfort is a common non-cancerous (benign) cause of abdominal pain in pregnancy. Your caregiver still must evaluate you.   You develop a severe headache that does not go away.   You develop visual problems, blurred or double vision.   If you have not felt your baby move for more than 1 hour. If you think the baby is not moving as much as usual, eat something with sugar in it and lie down on your left side for an hour. The baby should move at least 4 to 5 times per hour. Call right away if your baby moves less than that.   You fall, are in a car accident or any kind of trauma.   There is mental or physical violence at home.   Increase fluid intake   Follow up with office in AM Document Released: 03/15/2001 Document Revised: 03/10/2011 Document Reviewed: 09/17/2008 [facility] Patient Information 2012 [place], [place].

## 2011-07-28 ENCOUNTER — Telehealth (HOSPITAL_COMMUNITY): Payer: Self-pay | Admitting: *Deleted

## 2011-07-28 ENCOUNTER — Encounter (HOSPITAL_COMMUNITY): Payer: Self-pay | Admitting: *Deleted

## 2011-07-28 NOTE — Telephone Encounter (Signed)
Preadmission screen  

## 2011-07-29 ENCOUNTER — Inpatient Hospital Stay (HOSPITAL_COMMUNITY): Admission: RE | Admit: 2011-07-29 | Payer: Federal, State, Local not specified - PPO | Source: Ambulatory Visit

## 2011-08-03 ENCOUNTER — Inpatient Hospital Stay (HOSPITAL_COMMUNITY)
Admission: RE | Admit: 2011-08-03 | Discharge: 2011-08-05 | DRG: 373 | Disposition: A | Discharge status: Home / Self Care | Payer: Federal, State, Local not specified - PPO | Source: Ambulatory Visit | Attending: Obstetrics and Gynecology | Admitting: Obstetrics and Gynecology

## 2011-08-03 ENCOUNTER — Encounter (HOSPITAL_COMMUNITY): Payer: Self-pay

## 2011-08-03 ENCOUNTER — Inpatient Hospital Stay (HOSPITAL_COMMUNITY): Payer: Federal, State, Local not specified - PPO | Admitting: Anesthesiology

## 2011-08-03 ENCOUNTER — Encounter (HOSPITAL_COMMUNITY): Payer: Self-pay | Admitting: Anesthesiology

## 2011-08-03 DIAGNOSIS — O26899 Other specified pregnancy related conditions, unspecified trimester: Secondary | ICD-10-CM | POA: Diagnosis present

## 2011-08-03 DIAGNOSIS — K8020 Calculus of gallbladder without cholecystitis without obstruction: Secondary | ICD-10-CM | POA: Diagnosis present

## 2011-08-03 LAB — CBC
HCT: 32.4 % — ABNORMAL LOW (ref 36.0–46.0)
Hemoglobin: 10.2 g/dL — ABNORMAL LOW (ref 12.0–15.0)
MCH: 28.1 pg (ref 26.0–34.0)
MCHC: 31.5 g/dL (ref 30.0–36.0)
MCV: 89.3 fL (ref 78.0–100.0)
Platelets: 222 10*3/uL (ref 150–400)
RBC: 3.63 MIL/uL — ABNORMAL LOW (ref 3.87–5.11)
RDW: 13.5 % (ref 11.5–15.5)
WBC: 5 10*3/uL (ref 4.0–10.5)

## 2011-08-03 LAB — RPR: RPR Ser Ql: NONREACTIVE

## 2011-08-03 MED ORDER — CITRIC ACID-SODIUM CITRATE 334-500 MG/5ML PO SOLN: 30.0000 mL | ORAL | Status: DC | PRN

## 2011-08-03 MED ORDER — DIPHENHYDRAMINE HCL 25 MG PO CAPS: 25.0000 mg | ORAL_CAPSULE | Freq: Four times a day (QID) | ORAL | Status: DC | PRN

## 2011-08-03 MED ORDER — ONDANSETRON HCL 4 MG/2ML IJ SOLN: 4.0000 mg | INTRAMUSCULAR | Status: DC | PRN

## 2011-08-03 MED ORDER — OXYTOCIN 20 UNITS IN LACTATED RINGERS INFUSION - SIMPLE
1.0000 m[IU]/min | INTRAVENOUS | Status: DC
  Administered 2011-08-03: 2 m[IU]/min via INTRAVENOUS
  Filled 2011-08-03: qty 1000

## 2011-08-03 MED ORDER — EPHEDRINE 5 MG/ML INJ
10.0000 mg | INTRAVENOUS | Status: DC | PRN
  Filled 2011-08-03: qty 4

## 2011-08-03 MED ORDER — LIDOCAINE HCL (PF) 1 % IJ SOLN
INTRAMUSCULAR | Status: DC | PRN
  Administered 2011-08-03 (×3): 4 mL

## 2011-08-03 MED ORDER — FENTANYL 2.5 MCG/ML BUPIVACAINE 1/10 % EPIDURAL INFUSION (WH - ANES)
14.0000 mL/h | INTRAMUSCULAR | Status: DC
  Administered 2011-08-03: 14 mL/h via EPIDURAL
  Filled 2011-08-03: qty 60

## 2011-08-03 MED ORDER — DIPHENHYDRAMINE HCL 50 MG/ML IJ SOLN: 12.5000 mg | INTRAMUSCULAR | Status: DC | PRN

## 2011-08-03 MED ORDER — ONDANSETRON HCL 4 MG PO TABS: 4.0000 mg | ORAL_TABLET | ORAL | Status: DC | PRN

## 2011-08-03 MED ORDER — EPHEDRINE 5 MG/ML INJ: 10.0000 mg | INTRAVENOUS | Status: DC | PRN

## 2011-08-03 MED ORDER — DIBUCAINE 1 % RE OINT: 1.0000 "application " | TOPICAL_OINTMENT | RECTAL | Status: DC | PRN

## 2011-08-03 MED ORDER — IBUPROFEN 600 MG PO TABS
600.0000 mg | ORAL_TABLET | Freq: Four times a day (QID) | ORAL | Status: DC
  Administered 2011-08-03 – 2011-08-05 (×6): 600 mg via ORAL
  Filled 2011-08-03 (×6): qty 1

## 2011-08-03 MED ORDER — WITCH HAZEL-GLYCERIN EX PADS: 1.0000 "application " | MEDICATED_PAD | CUTANEOUS | Status: DC | PRN

## 2011-08-03 MED ORDER — LACTATED RINGERS IV SOLN: 500.0000 mL | INTRAVENOUS | Status: DC | PRN

## 2011-08-03 MED ORDER — LANOLIN HYDROUS EX OINT: TOPICAL_OINTMENT | CUTANEOUS | Status: DC | PRN

## 2011-08-03 MED ORDER — MEDROXYPROGESTERONE ACETATE 150 MG/ML IM SUSP: 150.0000 mg | INTRAMUSCULAR | Status: DC | PRN

## 2011-08-03 MED ORDER — OXYTOCIN 20 UNITS IN LACTATED RINGERS INFUSION - SIMPLE: 125.0000 mL/h | Freq: Once | INTRAVENOUS | Status: DC

## 2011-08-03 MED ORDER — ONDANSETRON HCL 4 MG/2ML IJ SOLN: 4.0000 mg | Freq: Four times a day (QID) | INTRAMUSCULAR | Status: DC | PRN

## 2011-08-03 MED ORDER — TETANUS-DIPHTH-ACELL PERTUSSIS 5-2.5-18.5 LF-MCG/0.5 IM SUSP: 0.5000 mL | Freq: Once | INTRAMUSCULAR | Status: DC

## 2011-08-03 MED ORDER — CITALOPRAM HYDROBROMIDE 20 MG PO TABS
20.0000 mg | ORAL_TABLET | Freq: Every day | ORAL | Status: DC
  Administered 2011-08-03 – 2011-08-04 (×2): 20 mg via ORAL
  Filled 2011-08-03 (×2): qty 1

## 2011-08-03 MED ORDER — PHENYLEPHRINE 40 MCG/ML (10ML) SYRINGE FOR IV PUSH (FOR BLOOD PRESSURE SUPPORT)
80.0000 ug | PREFILLED_SYRINGE | INTRAVENOUS | Status: DC | PRN
  Filled 2011-08-03: qty 5

## 2011-08-03 MED ORDER — SENNOSIDES-DOCUSATE SODIUM 8.6-50 MG PO TABS
2.0000 | ORAL_TABLET | Freq: Every day | ORAL | Status: DC
  Administered 2011-08-03 – 2011-08-04 (×2): 2 via ORAL

## 2011-08-03 MED ORDER — LIDOCAINE HCL (PF) 1 % IJ SOLN: 30.0000 mL | INTRAMUSCULAR | Status: DC | PRN

## 2011-08-03 MED ORDER — SIMETHICONE 80 MG PO CHEW
80.0000 mg | CHEWABLE_TABLET | ORAL | Status: DC | PRN
  Administered 2011-08-04: 80 mg via ORAL

## 2011-08-03 MED ORDER — BENZOCAINE-MENTHOL 20-0.5 % EX AERO
1.0000 "application " | INHALATION_SPRAY | CUTANEOUS | Status: DC | PRN
  Administered 2011-08-03: 1 via TOPICAL
  Filled 2011-08-03: qty 56

## 2011-08-03 MED ORDER — TERBUTALINE SULFATE 1 MG/ML IJ SOLN: 0.2500 mg | Freq: Once | INTRAMUSCULAR | Status: DC | PRN

## 2011-08-03 MED ORDER — LACTATED RINGERS IV SOLN
INTRAVENOUS | Status: DC
  Administered 2011-08-03: 08:00:00 via INTRAVENOUS

## 2011-08-03 MED ORDER — MEASLES, MUMPS & RUBELLA VAC ~~LOC~~ INJ
0.5000 mL | INJECTION | Freq: Once | SUBCUTANEOUS | Status: DC
  Filled 2011-08-03: qty 0.5

## 2011-08-03 MED ORDER — IBUPROFEN 600 MG PO TABS: 600.0000 mg | ORAL_TABLET | Freq: Four times a day (QID) | ORAL | Status: DC | PRN

## 2011-08-03 MED ORDER — LACTATED RINGERS IV SOLN
500.0000 mL | Freq: Once | INTRAVENOUS | Status: AC
  Administered 2011-08-03: 12:00:00 via INTRAVENOUS

## 2011-08-03 MED ORDER — PHENYLEPHRINE 40 MCG/ML (10ML) SYRINGE FOR IV PUSH (FOR BLOOD PRESSURE SUPPORT): 80.0000 ug | PREFILLED_SYRINGE | INTRAVENOUS | Status: DC | PRN

## 2011-08-03 MED ORDER — HYDROCODONE-ACETAMINOPHEN 5-325 MG PO TABS
1.0000 | ORAL_TABLET | Freq: Four times a day (QID) | ORAL | Status: DC | PRN
  Administered 2011-08-03: 2 via ORAL
  Administered 2011-08-04: 1 via ORAL
  Administered 2011-08-04 – 2011-08-05 (×2): 2 via ORAL
  Administered 2011-08-05: 1 via ORAL
  Filled 2011-08-03 (×2): qty 2
  Filled 2011-08-03: qty 1
  Filled 2011-08-03: qty 2
  Filled 2011-08-03 (×2): qty 1

## 2011-08-03 MED ORDER — FLEET ENEMA 7-19 GM/118ML RE ENEM: 1.0000 | ENEMA | RECTAL | Status: DC | PRN

## 2011-08-03 MED ORDER — PRENATAL MULTIVITAMIN CH
1.0000 | ORAL_TABLET | Freq: Every day | ORAL | Status: DC
  Administered 2011-08-03 – 2011-08-04 (×2): 1 via ORAL
  Filled 2011-08-03 (×2): qty 1

## 2011-08-03 MED ORDER — OXYTOCIN BOLUS FROM INFUSION
500.0000 mL | Freq: Once | INTRAVENOUS | Status: DC
  Filled 2011-08-03: qty 500

## 2011-08-03 NOTE — Plan of Care (Signed)
Problem: Phase I Progression Outcomes Goal: Pain controlled with appropriate interventions Outcome: Progressing Pt is onmotrin and RX for Hydrocodone for scoliosis and back pain associated with the condition. We are doing NAS scores with infant as mother intends to breast feed.

## 2011-08-03 NOTE — Anesthesia Preprocedure Evaluation (Addendum)
Anesthesia Evaluation  Patient identified by MRN, date of birth, ID band Patient awake    Reviewed: Allergy & Precautions, H&P , NPO status , Patient's Chart, lab work & pertinent test results, reviewed documented beta blocker date and time   Airway Mallampati: II TM Distance: >3 FB Neck ROM: full    Dental  (+) Teeth Intact   Pulmonary neg pulmonary ROS,  breath sounds clear to auscultation        Cardiovascular negative cardio ROS  Rhythm:regular Rate:Normal     Neuro/Psych  Headaches (last migraine in 2nd trimester), PSYCHIATRIC DISORDERS (anxiety) Scoliosis - has been taking vicodin off and on for back pain and sciatica    GI/Hepatic Neg liver ROS, GERD-  Medicated,  Endo/Other  negative endocrine ROS  Renal/GU negative Renal ROS  negative genitourinary   Musculoskeletal   Abdominal   Peds  Hematology negative hematology ROS (+)   Anesthesia Other Findings NOT ALLERGIC to fentanyl and bupivicaine.  Patient had hypotension after her first two epidurals which is a normal physiologic response, not an allergic reaction.  Patient understands that and says she has never reported an allergy to fentanyl and bupivicaine.  Reproductive/Obstetrics (+) Pregnancy                          Anesthesia Physical Anesthesia Plan  ASA: II  Anesthesia Plan: Epidural   Post-op Pain Management:    Induction:   Airway Management Planned:   Additional Equipment:   Intra-op Plan:   Post-operative Plan:   Informed Consent: I have reviewed the patients History and Physical, chart, labs and discussed the procedure including the risks, benefits and alternatives for the proposed anesthesia with the patient or authorized representative who has indicated his/her understanding and acceptance.     Plan Discussed with:   Anesthesia Plan Comments:         Anesthesia Quick Evaluation

## 2011-08-03 NOTE — H&P (Signed)
31 yo G4P2 @ 37+5 wks presents for IOL.  Pregancy complicated by severe migraines and gall stones requiring large amnts of narcotics to control pain.  Amnio done last week was borderline L/S 2.1:1 w/ negative PG.  Plan was for IOL one week after amnio.  Pt understands risk of prematurity however can not tolerate pain from gallstones any longer.  + wt loss & nausea.  + FM  Past Histroy:  See hollister  AF, VSS FHT reassuring Toco sporadic Gen - NAD Abd - gravid, TTP RUQ Cvx - 3-4/50/-2 AROM - clear  A/P:  Pitocin IOL Find GBS results Epidural prn

## 2011-08-03 NOTE — Anesthesia Procedure Notes (Signed)
Epidural Patient location during procedure: OB Start time: 08/03/2011 12:22 PM Reason for block: procedure for pain  Staffing Performed by: anesthesiologist   Preanesthetic Checklist Completed: patient identified, site marked, surgical consent, pre-op evaluation, timeout performed, IV checked, risks and benefits discussed and monitors and equipment checked  Epidural Patient position: sitting Prep: site prepped and draped and DuraPrep Patient monitoring: continuous pulse ox and blood pressure Approach: midline Injection technique: LOR air  Needle:  Needle type: Tuohy  Needle gauge: 17 G Needle length: 9 cm Needle insertion depth: 5 cm cm Catheter type: closed end flexible Catheter size: 19 Gauge Catheter at skin depth: 10 cm Test dose: negative  Assessment Events: blood not aspirated, injection not painful, no injection resistance, negative IV test and no paresthesia  Additional Notes Discussed risk of headache, infection, bleeding, nerve injury and failed or incomplete block.  Patient voices understanding and wishes to proceed.

## 2011-08-03 NOTE — Progress Notes (Signed)
Rapid 2nd stage of labor.  Pt progressed to 10cm from 5cm within .   Pt pushed x2, SVD of vigerous female w/ apgars 9,9 Placenta delivered spontaneous w/ 3VC 2nd degree midline epis repaired w/ 3-0 vicryl rapide Fundus firm EBL 350cc

## 2011-08-03 NOTE — Progress Notes (Signed)
Pt getting comfortable w/ epidural.    FHT reassuring toco Q2-5 Cvx 5cm per RN exam  A/P:  Continue pitocin, exp mngt

## 2011-08-03 NOTE — Progress Notes (Signed)
GBS negative per office chart.  ga

## 2011-08-03 NOTE — Anesthesia Postprocedure Evaluation (Signed)
Anesthesia Post Note  Patient: Cynthia Brooks  Procedure(s) Performed: * No procedures listed *  Anesthesia type: Epidural  Patient location: Mother/Baby  Post pain: Pain level controlled  Post assessment: Post-op Vital signs reviewed  Last Vitals:  Filed Vitals:   08/03/11 1700  BP: 114/73  Pulse: 94  Temp: 36.4 C  Resp: 16    Post vital signs: Reviewed  Level of consciousness:alert  Complications: No apparent anesthesia complications

## 2011-08-04 ENCOUNTER — Inpatient Hospital Stay (HOSPITAL_COMMUNITY): Payer: Federal, State, Local not specified - PPO

## 2011-08-04 LAB — COMPREHENSIVE METABOLIC PANEL
ALT: 18 U/L (ref 0–35)
AST: 45 U/L — ABNORMAL HIGH (ref 0–37)
Albumin: 2.3 g/dL — ABNORMAL LOW (ref 3.5–5.2)
Alkaline Phosphatase: 171 U/L — ABNORMAL HIGH (ref 39–117)
BUN: 5 mg/dL — ABNORMAL LOW (ref 6–23)
CO2: 24 mEq/L (ref 19–32)
Calcium: 9.1 mg/dL (ref 8.4–10.5)
Chloride: 108 mEq/L (ref 96–112)
Creatinine, Ser: 0.65 mg/dL (ref 0.50–1.10)
GFR calc Af Amer: >90 mL/min (ref >90)
GFR calc non Af Amer: >90 mL/min (ref >90)
Glucose, Bld: 102 mg/dL — ABNORMAL HIGH (ref 70–99)
Potassium: 3.9 mEq/L (ref 3.5–5.1)
Sodium: 140 mEq/L (ref 135–145)
Total Bilirubin: 0.5 mg/dL (ref 0.3–1.2)
Total Protein: 5.6 g/dL — ABNORMAL LOW (ref 6.0–8.3)

## 2011-08-04 LAB — LIPASE, BLOOD: Lipase: 35 U/L (ref 11–59)

## 2011-08-04 LAB — CBC
HCT: 29.4 % — ABNORMAL LOW (ref 36.0–46.0)
Hemoglobin: 9.2 g/dL — ABNORMAL LOW (ref 12.0–15.0)
MCH: 28 pg (ref 26.0–34.0)
MCHC: 31.3 g/dL (ref 30.0–36.0)
MCV: 89.4 fL (ref 78.0–100.0)
Platelets: 200 10*3/uL (ref 150–400)
RBC: 3.29 MIL/uL — ABNORMAL LOW (ref 3.87–5.11)
RDW: 13.4 % (ref 11.5–15.5)
WBC: 8.4 10*3/uL (ref 4.0–10.5)

## 2011-08-04 LAB — AMYLASE: Amylase: 74 U/L (ref 0–105)

## 2011-08-04 MED ORDER — BUTORPHANOL TARTRATE 2 MG/ML IJ SOLN
1.0000 mg | Freq: Once | INTRAMUSCULAR | Status: AC
  Administered 2011-08-04: 2 mg via INTRAVENOUS

## 2011-08-04 MED ORDER — FAMOTIDINE IN NACL 20-0.9 MG/50ML-% IV SOLN
20.0000 mg | Freq: Two times a day (BID) | INTRAVENOUS | Status: DC
  Administered 2011-08-04 (×2): 20 mg via INTRAVENOUS
  Filled 2011-08-04 (×3): qty 50

## 2011-08-04 MED ORDER — DIPHENHYDRAMINE HCL 12.5 MG/5ML PO ELIX
12.5000 mg | ORAL_SOLUTION | Freq: Four times a day (QID) | ORAL | Status: DC | PRN
  Filled 2011-08-04: qty 5

## 2011-08-04 MED ORDER — ONDANSETRON HCL 4 MG/2ML IJ SOLN: 4.0000 mg | Freq: Four times a day (QID) | INTRAMUSCULAR | Status: DC | PRN

## 2011-08-04 MED ORDER — LACTATED RINGERS IV SOLN
INTRAVENOUS | Status: DC
  Administered 2011-08-04: 15:00:00 via INTRAVENOUS

## 2011-08-04 MED ORDER — BUTORPHANOL TARTRATE 2 MG/ML IJ SOLN: 2.0000 mg | Freq: Once | INTRAMUSCULAR | Status: DC

## 2011-08-04 MED ORDER — SODIUM CHLORIDE 0.9 % IJ SOLN: 9.0000 mL | INTRAMUSCULAR | Status: DC | PRN

## 2011-08-04 MED ORDER — MORPHINE SULFATE (PF) 1 MG/ML IV SOLN: INTRAVENOUS | Status: DC

## 2011-08-04 MED ORDER — DIPHENHYDRAMINE HCL 50 MG/ML IJ SOLN: 12.5000 mg | Freq: Four times a day (QID) | INTRAMUSCULAR | Status: DC | PRN

## 2011-08-04 MED ORDER — NALOXONE HCL 0.4 MG/ML IJ SOLN: 0.4000 mg | INTRAMUSCULAR | Status: DC | PRN

## 2011-08-04 MED ORDER — HYDROMORPHONE HCL 2 MG PO TABS
2.0000 mg | ORAL_TABLET | ORAL | Status: DC | PRN
  Administered 2011-08-04: 4 mg via ORAL

## 2011-08-04 MED ORDER — BUTORPHANOL TARTRATE 2 MG/ML IJ SOLN
INTRAMUSCULAR | Status: AC
  Filled 2011-08-04: qty 1

## 2011-08-04 MED ORDER — HYDROMORPHONE HCL 2 MG PO TABS
ORAL_TABLET | ORAL | Status: AC
  Administered 2011-08-04: 4 mg via ORAL
  Filled 2011-08-04: qty 2

## 2011-08-04 NOTE — Progress Notes (Signed)
Post Partum Day 1 Subjective: no complaints, up ad lib, voiding, tolerating PO and + flatus  Objective: Blood pressure 108/68, pulse 76, temperature 97.3 F (36.3 C), temperature source Oral, resp. rate 18, height 5' 3.5" (1.613 m), weight 85.73 kg (189 lb), SpO2 98.00%, unknown if currently breastfeeding.  Physical Exam:  General: alert and cooperative Lochia: appropriate Uterine Fundus: firm Incision: perineum intact DVT Evaluation: No evidence of DVT seen on physical exam.   Basename 08/04/11 0600 08/03/11 0800  HGB 9.2* 10.2*  HCT 29.4* 32.4*    Assessment/Plan: Plan for discharge tomorrow   LOS: 1 day   Cynthia Brooks G 08/04/2011, 7:44 AM

## 2011-08-04 NOTE — Progress Notes (Signed)
Pt felt baby was not getting enough to eat and was concerned about passing pain medication through her milk.  She requested formula (Similac) to make her feel infant was getting some milk.  Pt still plans to breast feed, but would like to see lactation.

## 2011-08-04 NOTE — Progress Notes (Signed)
C/o epigastric pain did not eat well this am (ate fatty foods)  Dr. Marcelle Overlie notified.  Orders received.  To observe

## 2011-08-04 NOTE — Progress Notes (Signed)
C/o severe epigastric pain   Given dilaudid 4 mg po per Dr. Charlesetta Shanks order.  After 1h pt states getting no relief.  Dr. Marcelle Overlie notified stadol order given ( pt states it worked with last attack)  Pt states does not want MS PCA when this offered prior to stadol.  After stadol given, pt states still not getting relief from pain, however, talking to husband, and seems to have some relief.

## 2011-08-05 MED ORDER — HYDROCODONE-ACETAMINOPHEN 5-325 MG PO TABS: 1.0000 | ORAL_TABLET | Freq: Four times a day (QID) | ORAL | Status: AC | PRN

## 2011-08-05 MED ORDER — IBUPROFEN 600 MG PO TABS: 600.0000 mg | ORAL_TABLET | Freq: Four times a day (QID) | ORAL | Status: AC

## 2011-08-05 NOTE — Discharge Summary (Signed)
Obstetric Discharge Summary Reason for Admission: onset of labor Prenatal Procedures: ultrasound Intrapartum Procedures: spontaneous vaginal delivery Postpartum Procedures: ultrasound for GB pain Complications-Operative and Postpartum: 2 degree perineal laceration Hemoglobin  Date Value Range Status  08/04/2011 9.2* 12.0-15.0 (g/dL) Final     HCT  Date Value Range Status  08/04/2011 29.4* 36.0-46.0 (%) Final    Physical Exam:  General: alert and cooperative Lochia: appropriate Uterine Fundus: firm Incision: perineum intact DVT Evaluation: No evidence of DVT seen on physical exam.  Discharge Diagnoses: Term Pregnancy-delivered  Discharge Information: Date: 08/05/2011 Activity: pelvic rest Diet: routine and low fat Medications: PNV, Ibuprofen, Vicodin and celexa Condition: improved Instructions: refer to practice specific booklet Discharge to: home   Newborn Data: Live born female  Birth Weight: 7 lb 7 oz (3374 g) APGAR: 9, 9  Home with mother.  Derelle Cockrell G 08/05/2011, 8:18 AM

## 2011-08-05 NOTE — Progress Notes (Signed)
Post Partum Day 2 Subjective: up ad lib, voiding, tolerating PO and diarrhea, she associates with IV Pepcid. RUQ pain improved this am and desires DC  Objective: Blood pressure 117/77, pulse 67, temperature 97.5 F (36.4 C), temperature source Oral, resp. rate 18, height 5' 3.5" (1.613 m), weight 85.73 kg (189 lb), SpO2 98.00%, unknown if currently breastfeeding.  Physical Exam:  General: alert and cooperative Lochia: appropriate Uterine Fundus: firm Incision: perineum intact DVT Evaluation: No evidence of DVT seen on physical exam.   Basename 08/04/11 0600 08/03/11 0800  HGB 9.2* 10.2*  HCT 29.4* 32.4*    Assessment/Plan: Discharge home Refer to general surgeon for consult   LOS: 2 days   Cynthia Brooks 08/05/2011, 7:53 AM

## 2011-08-08 ENCOUNTER — Encounter (INDEPENDENT_AMBULATORY_CARE_PROVIDER_SITE_OTHER): Payer: Self-pay | Admitting: Surgery

## 2011-08-13 ENCOUNTER — Emergency Department (HOSPITAL_COMMUNITY)
Admission: EM | Admit: 2011-08-13 | Discharge: 2011-08-13 | Disposition: A | Discharge status: Home / Self Care | Payer: Federal, State, Local not specified - PPO | Attending: Emergency Medicine | Admitting: Emergency Medicine

## 2011-08-13 ENCOUNTER — Encounter (HOSPITAL_COMMUNITY): Payer: Self-pay

## 2011-08-13 DIAGNOSIS — R51 Headache: Secondary | ICD-10-CM | POA: Insufficient documentation

## 2011-08-13 DIAGNOSIS — M542 Cervicalgia: Secondary | ICD-10-CM | POA: Insufficient documentation

## 2011-08-13 DIAGNOSIS — H53149 Visual discomfort, unspecified: Secondary | ICD-10-CM | POA: Insufficient documentation

## 2011-08-13 MED ORDER — METHOCARBAMOL 500 MG PO TABS: ORAL_TABLET | ORAL | Status: DC

## 2011-08-13 MED ORDER — DIPHENHYDRAMINE HCL 50 MG/ML IJ SOLN
25.0000 mg | Freq: Once | INTRAMUSCULAR | Status: AC
  Administered 2011-08-13: 25 mg via INTRAVENOUS
  Filled 2011-08-13: qty 1

## 2011-08-13 MED ORDER — SODIUM CHLORIDE 0.9 % IV SOLN
INTRAVENOUS | Status: DC
Start: 2011-08-13 — End: 2011-08-14
  Administered 2011-08-13: 20:00:00 via INTRAVENOUS

## 2011-08-13 MED ORDER — ONDANSETRON HCL 4 MG/2ML IJ SOLN
4.0000 mg | Freq: Once | INTRAMUSCULAR | Status: AC
  Administered 2011-08-13: 4 mg via INTRAVENOUS
  Filled 2011-08-13: qty 2

## 2011-08-13 MED ORDER — SODIUM CHLORIDE 0.9 % IV BOLUS (SEPSIS): 1500.0000 mL | Freq: Once | INTRAVENOUS | Status: DC

## 2011-08-13 MED ORDER — METHOCARBAMOL 500 MG PO TABS: 1000.0000 mg | ORAL_TABLET | Freq: Three times a day (TID) | ORAL | Status: DC

## 2011-08-13 MED ORDER — TRAMADOL-ACETAMINOPHEN 37.5-325 MG PO TABS: ORAL_TABLET | ORAL | Status: AC

## 2011-08-13 MED ORDER — METOCLOPRAMIDE HCL 5 MG/ML IJ SOLN
10.0000 mg | Freq: Once | INTRAMUSCULAR | Status: AC
  Administered 2011-08-13: 10 mg via INTRAVENOUS
  Filled 2011-08-13: qty 2

## 2011-08-13 NOTE — Discharge Instructions (Signed)
Dry heat on your neck. Take ibuprofen for pain. Try the Ultracet for pain. Take Robaxin for the sore muscles in your neck. Have your doctor recheck you on Monday. At this point I do not believe your headache is a spinal headache. Recheck if you get fever, severe headache, or uncontrollable vomiting

## 2011-08-13 NOTE — ED Notes (Signed)
Pt in from home states headache at the base of skull x5 days pt  state delivered baby on may 1st  Was told to come in d/t the epidural she received while hospitalized pt states some nausea and neck stiffness
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
## 2011-08-30 ENCOUNTER — Ambulatory Visit (INDEPENDENT_AMBULATORY_CARE_PROVIDER_SITE_OTHER): Payer: Self-pay | Admitting: Surgery

## 2011-09-22 ENCOUNTER — Ambulatory Visit (INDEPENDENT_AMBULATORY_CARE_PROVIDER_SITE_OTHER): Payer: Self-pay | Admitting: Surgery

## 2011-09-27 ENCOUNTER — Encounter (INDEPENDENT_AMBULATORY_CARE_PROVIDER_SITE_OTHER): Payer: Self-pay | Admitting: Surgery

## 2011-09-27 ENCOUNTER — Ambulatory Visit (INDEPENDENT_AMBULATORY_CARE_PROVIDER_SITE_OTHER): Payer: Federal, State, Local not specified - PPO | Admitting: Surgery

## 2011-09-27 VITALS — BP 118/72 | HR 82 | Temp 97.4°F | Resp 14 | Ht 64.0 in | Wt 156.0 lb

## 2011-09-27 DIAGNOSIS — K8020 Calculus of gallbladder without cholecystitis without obstruction: Secondary | ICD-10-CM | POA: Insufficient documentation
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
## 2011-10-07 ENCOUNTER — Telehealth (INDEPENDENT_AMBULATORY_CARE_PROVIDER_SITE_OTHER): Payer: Self-pay

## 2011-10-07 NOTE — Telephone Encounter (Signed)
The patient called stating Dr Magnus Ivan prescribed Percocet to help her gallbladder pain until surgery.  It is making her sick with nausea and vomiting.  She is requesting Vicodin extra strength 7.5mg  with less Tylenol in it.  She uses [facility] in [place] [phone].  I got the order from Dr Magnus Ivan to call in Vicodin 7.5/325 #30 with 1 refill.  I called in to [facility] 7.5/325mg  1 po q6hrs prn pain #30 plus 1 refill.  I notifed the pt

## 2011-10-10 ENCOUNTER — Encounter (HOSPITAL_COMMUNITY): Payer: Self-pay | Admitting: Pharmacy Technician

## 2011-10-12 ENCOUNTER — Encounter (HOSPITAL_COMMUNITY): Payer: Self-pay

## 2011-10-12 ENCOUNTER — Inpatient Hospital Stay (HOSPITAL_COMMUNITY)
Admission: AD | Admit: 2011-10-12 | Discharge: 2011-10-14 | DRG: 494 | Disposition: A | Discharge status: Home / Self Care | Payer: Federal, State, Local not specified - PPO | Source: Ambulatory Visit | Attending: Surgery | Admitting: Surgery

## 2011-10-12 ENCOUNTER — Other Ambulatory Visit (INDEPENDENT_AMBULATORY_CARE_PROVIDER_SITE_OTHER): Payer: Self-pay | Admitting: Surgery

## 2011-10-12 ENCOUNTER — Telehealth (INDEPENDENT_AMBULATORY_CARE_PROVIDER_SITE_OTHER): Payer: Self-pay | Admitting: General Surgery

## 2011-10-12 ENCOUNTER — Telehealth (INDEPENDENT_AMBULATORY_CARE_PROVIDER_SITE_OTHER): Payer: Self-pay

## 2011-10-12 DIAGNOSIS — Z9089 Acquired absence of other organs: Secondary | ICD-10-CM

## 2011-10-12 DIAGNOSIS — K8010 Calculus of gallbladder with chronic cholecystitis without obstruction: Secondary | ICD-10-CM

## 2011-10-12 DIAGNOSIS — F17200 Nicotine dependence, unspecified, uncomplicated: Secondary | ICD-10-CM | POA: Diagnosis present

## 2011-10-12 DIAGNOSIS — Z8249 Family history of ischemic heart disease and other diseases of the circulatory system: Secondary | ICD-10-CM

## 2011-10-12 DIAGNOSIS — Z833 Family history of diabetes mellitus: Secondary | ICD-10-CM

## 2011-10-12 DIAGNOSIS — K8020 Calculus of gallbladder without cholecystitis without obstruction: Principal | ICD-10-CM | POA: Diagnosis present

## 2011-10-12 LAB — COMPREHENSIVE METABOLIC PANEL
ALT: 37 U/L — ABNORMAL HIGH (ref 0–35)
AST: 69 U/L — ABNORMAL HIGH (ref 0–37)
Albumin: 4.2 g/dL (ref 3.5–5.2)
Alkaline Phosphatase: 137 U/L — ABNORMAL HIGH (ref 39–117)
BUN: 12 mg/dL (ref 6–23)
CO2: 25 mEq/L (ref 19–32)
Calcium: 9.4 mg/dL (ref 8.4–10.5)
Chloride: 101 mEq/L (ref 96–112)
Creatinine, Ser: 0.99 mg/dL (ref 0.50–1.10)
GFR calc Af Amer: 86 mL/min — ABNORMAL LOW (ref >90)
GFR calc non Af Amer: 75 mL/min — ABNORMAL LOW (ref >90)
Glucose, Bld: 113 mg/dL — ABNORMAL HIGH (ref 70–99)
Potassium: 4 mEq/L (ref 3.5–5.1)
Sodium: 137 mEq/L (ref 135–145)
Total Bilirubin: 0.2 mg/dL — ABNORMAL LOW (ref 0.3–1.2)
Total Protein: 7.5 g/dL (ref 6.0–8.3)

## 2011-10-12 LAB — CBC
HCT: 35.3 % — ABNORMAL LOW (ref 36.0–46.0)
Hemoglobin: 11.4 g/dL — ABNORMAL LOW (ref 12.0–15.0)
MCH: 29.7 pg (ref 26.0–34.0)
MCHC: 32.3 g/dL (ref 30.0–36.0)
MCV: 91.9 fL (ref 78.0–100.0)
Platelets: 238 10*3/uL (ref 150–400)
RBC: 3.84 MIL/uL — ABNORMAL LOW (ref 3.87–5.11)
RDW: 15.4 % (ref 11.5–15.5)
WBC: 6.4 10*3/uL (ref 4.0–10.5)

## 2011-10-12 MED ORDER — HYDROCODONE-ACETAMINOPHEN 5-325 MG PO TABS
1.0000 | ORAL_TABLET | ORAL | Status: DC | PRN
  Administered 2011-10-13 (×2): 2 via ORAL
  Administered 2011-10-13: 1 via ORAL
  Filled 2011-10-12: qty 1
  Filled 2011-10-12 (×2): qty 2

## 2011-10-12 MED ORDER — ZOLPIDEM TARTRATE 5 MG PO TABS
10.0000 mg | ORAL_TABLET | Freq: Once | ORAL | Status: AC
  Administered 2011-10-12: 10 mg via ORAL
  Filled 2011-10-12: qty 2

## 2011-10-12 MED ORDER — CEFAZOLIN SODIUM 1-5 GM-% IV SOLN: 1.0000 g | INTRAVENOUS | Status: DC

## 2011-10-12 MED ORDER — CEFAZOLIN SODIUM-DEXTROSE 2-3 GM-% IV SOLR
2.0000 g | INTRAVENOUS | Status: AC
  Administered 2011-10-13: 2 g via INTRAVENOUS
  Filled 2011-10-12: qty 50

## 2011-10-12 MED ORDER — HYDROMORPHONE HCL PF 1 MG/ML IJ SOLN: 1.0000 mg | INTRAMUSCULAR | Status: DC | PRN

## 2011-10-12 MED ORDER — PANTOPRAZOLE SODIUM 40 MG IV SOLR
40.0000 mg | Freq: Every day | INTRAVENOUS | Status: DC
  Administered 2011-10-12 – 2011-10-13 (×2): 40 mg via INTRAVENOUS
  Filled 2011-10-12 (×4): qty 40

## 2011-10-12 MED ORDER — POTASSIUM CHLORIDE IN NACL 20-0.9 MEQ/L-% IV SOLN
INTRAVENOUS | Status: DC
  Administered 2011-10-12: 21:00:00 via INTRAVENOUS
  Administered 2011-10-13 (×2): 1000 mL via INTRAVENOUS
  Filled 2011-10-12 (×6): qty 1000

## 2011-10-12 NOTE — Telephone Encounter (Signed)
I called the pt and caught her as she was on her way by her house and I advised her to go directly to admitting at [facility].  Dr Magnus Ivan plans to direct admit her.

## 2011-10-12 NOTE — Telephone Encounter (Signed)
Pt calling with another GB attack; surgery scheduled for 10/25/11 with Dr. Magnus Ivan.  Suggested she consider going to the ER if pain cannot be controlled with her po meds.

## 2011-10-12 NOTE — Telephone Encounter (Signed)
The pt was instructed to go to be admitted.  She is unsure because she has 3 children including a 39 month old.  She doesn't know about who will care for them and she wants to make sure the surgery is going to be done and when can she go home.  I spoke to Dr Magnus Ivan and he says all depends on what emergency cases get lined up for him tonight to do in the morning.  He can't promise her a time but if done in the am she could go home tomorrow.  After informing the pt and discussing at length, she has worked it out where she will go in [place].

## 2011-10-12 NOTE — Telephone Encounter (Signed)
Pt is coming to the hospital.  Please send her directly to admitting.
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
## 2011-10-13 ENCOUNTER — Inpatient Hospital Stay (HOSPITAL_COMMUNITY): Payer: Federal, State, Local not specified - PPO | Admitting: Anesthesiology

## 2011-10-13 ENCOUNTER — Encounter (HOSPITAL_COMMUNITY): Admission: AD | Disposition: A | Discharge status: Home / Self Care | Payer: Self-pay | Source: Ambulatory Visit

## 2011-10-13 ENCOUNTER — Encounter (HOSPITAL_COMMUNITY): Payer: Self-pay | Admitting: Anesthesiology

## 2011-10-13 ENCOUNTER — Inpatient Hospital Stay (HOSPITAL_COMMUNITY): Payer: Federal, State, Local not specified - PPO

## 2011-10-13 HISTORY — PX: CHOLECYSTECTOMY: SHX55

## 2011-10-13 LAB — MRSA PCR SCREENING: MRSA by PCR: NEGATIVE

## 2011-10-13 SURGERY — LAPAROSCOPIC CHOLECYSTECTOMY WITH INTRAOPERATIVE CHOLANGIOGRAM
Anesthesia: General | Site: Abdomen | Wound class: Clean Contaminated

## 2011-10-13 MED ORDER — OXYCODONE-ACETAMINOPHEN 5-325 MG PO TABS
1.0000 | ORAL_TABLET | ORAL | Status: DC | PRN
  Administered 2011-10-13 – 2011-10-14 (×3): 2 via ORAL
  Filled 2011-10-13 (×3): qty 2

## 2011-10-13 MED ORDER — GLYCOPYRROLATE 0.2 MG/ML IJ SOLN
INTRAMUSCULAR | Status: DC | PRN
  Administered 2011-10-13 (×2): 0.4 mg via INTRAVENOUS
  Administered 2011-10-13: 0.1 mg via INTRAVENOUS

## 2011-10-13 MED ORDER — SODIUM CHLORIDE 0.9 % IV SOLN
INTRAVENOUS | Status: DC | PRN
  Administered 2011-10-13: 09:00:00

## 2011-10-13 MED ORDER — MIDAZOLAM HCL 5 MG/5ML IJ SOLN
INTRAMUSCULAR | Status: DC | PRN
  Administered 2011-10-13: 1 mg via INTRAVENOUS

## 2011-10-13 MED ORDER — ONDANSETRON HCL 4 MG/2ML IJ SOLN
INTRAMUSCULAR | Status: DC | PRN
  Administered 2011-10-13: 4 mg via INTRAVENOUS

## 2011-10-13 MED ORDER — NEOSTIGMINE METHYLSULFATE 1 MG/ML IJ SOLN
INTRAMUSCULAR | Status: DC | PRN
  Administered 2011-10-13: 2 mg via INTRAVENOUS
  Administered 2011-10-13: 3 mg via INTRAVENOUS

## 2011-10-13 MED ORDER — HYDROMORPHONE HCL PF 1 MG/ML IJ SOLN
INTRAMUSCULAR | Status: AC
  Filled 2011-10-13: qty 1

## 2011-10-13 MED ORDER — SODIUM CHLORIDE 0.9 % IR SOLN
Status: DC | PRN
  Administered 2011-10-13: 1

## 2011-10-13 MED ORDER — HYDROCODONE-ACETAMINOPHEN 5-325 MG PO TABS
ORAL_TABLET | ORAL | Status: AC
  Filled 2011-10-13: qty 2

## 2011-10-13 MED ORDER — FENTANYL CITRATE 0.05 MG/ML IJ SOLN
INTRAMUSCULAR | Status: DC | PRN
  Administered 2011-10-13 (×2): 100 ug via INTRAVENOUS
  Administered 2011-10-13: 25 ug via INTRAVENOUS

## 2011-10-13 MED ORDER — DROPERIDOL 2.5 MG/ML IJ SOLN
INTRAMUSCULAR | Status: AC
  Filled 2011-10-13: qty 2

## 2011-10-13 MED ORDER — BUPIVACAINE-EPINEPHRINE PF 0.25-1:200000 % IJ SOLN
INTRAMUSCULAR | Status: AC
  Filled 2011-10-13: qty 30

## 2011-10-13 MED ORDER — ONDANSETRON HCL 4 MG/2ML IJ SOLN
4.0000 mg | Freq: Four times a day (QID) | INTRAMUSCULAR | Status: DC | PRN
  Administered 2011-10-13: 4 mg via INTRAVENOUS

## 2011-10-13 MED ORDER — HYDROMORPHONE HCL PF 1 MG/ML IJ SOLN
0.2500 mg | INTRAMUSCULAR | Status: DC | PRN
  Administered 2011-10-13 (×2): 0.25 mg via INTRAVENOUS
  Administered 2011-10-13: 0.5 mg via INTRAVENOUS

## 2011-10-13 MED ORDER — 0.9 % SODIUM CHLORIDE (POUR BTL) OPTIME
TOPICAL | Status: DC | PRN
  Administered 2011-10-13: 1000 mL

## 2011-10-13 MED ORDER — ROCURONIUM BROMIDE 100 MG/10ML IV SOLN
INTRAVENOUS | Status: DC | PRN
  Administered 2011-10-13: 30 mg via INTRAVENOUS

## 2011-10-13 MED ORDER — LACTATED RINGERS IV SOLN
INTRAVENOUS | Status: DC | PRN
  Administered 2011-10-13 (×2): via INTRAVENOUS

## 2011-10-13 MED ORDER — BUPIVACAINE-EPINEPHRINE 0.25% -1:200000 IJ SOLN
INTRAMUSCULAR | Status: DC | PRN
  Administered 2011-10-13: 20 mL

## 2011-10-13 MED ORDER — ONDANSETRON HCL 4 MG/2ML IJ SOLN
INTRAMUSCULAR | Status: AC
  Filled 2011-10-13: qty 2

## 2011-10-13 MED ORDER — LIDOCAINE HCL (CARDIAC) 20 MG/ML IV SOLN
INTRAVENOUS | Status: DC | PRN
  Administered 2011-10-13: 100 mg via INTRAVENOUS

## 2011-10-13 MED ORDER — PROPOFOL 10 MG/ML IV EMUL
INTRAVENOUS | Status: DC | PRN
  Administered 2011-10-13: 170 mg via INTRAVENOUS

## 2011-10-13 MED ORDER — KETOROLAC TROMETHAMINE 30 MG/ML IJ SOLN
INTRAMUSCULAR | Status: DC | PRN
  Administered 2011-10-13: 30 mg via INTRAVENOUS

## 2011-10-13 MED ORDER — SUCCINYLCHOLINE CHLORIDE 20 MG/ML IJ SOLN
INTRAMUSCULAR | Status: DC | PRN
  Administered 2011-10-13: 100 mg via INTRAVENOUS

## 2011-10-13 MED ORDER — DROPERIDOL 2.5 MG/ML IJ SOLN
0.6250 mg | INTRAMUSCULAR | Status: DC | PRN
  Administered 2011-10-13: 0.625 mg via INTRAVENOUS

## 2011-10-13 MED ORDER — HYDROCODONE-ACETAMINOPHEN 5-325 MG PO TABS: 1.0000 | ORAL_TABLET | Freq: Four times a day (QID) | ORAL | Status: DC | PRN

## 2011-10-13 SURGICAL SUPPLY — 39 items
APPLIER CLIP 5 13 M/L LIGAMAX5 (MISCELLANEOUS) ×2
BANDAGE ADHESIVE 1X3 (GAUZE/BANDAGES/DRESSINGS) ×2 IMPLANT
BENZOIN TINCTURE PRP APPL 2/3 (GAUZE/BANDAGES/DRESSINGS) ×2 IMPLANT
CANISTER SUCTION 2500CC (MISCELLANEOUS) ×2 IMPLANT
CHLORAPREP W/TINT 26ML (MISCELLANEOUS) ×2 IMPLANT
CLIP APPLIE 5 13 M/L LIGAMAX5 (MISCELLANEOUS) ×1 IMPLANT
CLOTH BEACON ORANGE TIMEOUT ST (SAFETY) ×2 IMPLANT
COVER MAYO STAND STRL (DRAPES) ×2 IMPLANT
COVER SURGICAL LIGHT HANDLE (MISCELLANEOUS) ×2 IMPLANT
DECANTER SPIKE VIAL GLASS SM (MISCELLANEOUS) ×2 IMPLANT
DRAPE C-ARM 42X72 X-RAY (DRAPES) ×2 IMPLANT
ELECT REM PT RETURN 9FT ADLT (ELECTROSURGICAL) ×2
ELECTRODE REM PT RTRN 9FT ADLT (ELECTROSURGICAL) ×1 IMPLANT
GLOVE BIO SURGEON STRL SZ7 (GLOVE) ×2 IMPLANT
GLOVE BIO SURGEON STRL SZ7.5 (GLOVE) ×6 IMPLANT
GLOVE BIOGEL PI IND STRL 7.0 (GLOVE) ×1 IMPLANT
GLOVE BIOGEL PI IND STRL 7.5 (GLOVE) ×2 IMPLANT
GLOVE BIOGEL PI INDICATOR 7.0 (GLOVE) ×1
GLOVE BIOGEL PI INDICATOR 7.5 (GLOVE) ×2
GLOVE SURG SIGNA 7.5 PF LTX (GLOVE) ×2 IMPLANT
GOWN PREVENTION PLUS XLARGE (GOWN DISPOSABLE) ×2 IMPLANT
GOWN STRL NON-REIN LRG LVL3 (GOWN DISPOSABLE) ×6 IMPLANT
KIT BASIN OR (CUSTOM PROCEDURE TRAY) ×2 IMPLANT
KIT ROOM TURNOVER OR (KITS) ×2 IMPLANT
NS IRRIG 1000ML POUR BTL (IV SOLUTION) ×2 IMPLANT
PAD ARMBOARD 7.5X6 YLW CONV (MISCELLANEOUS) ×2 IMPLANT
POUCH SPECIMEN RETRIEVAL 10MM (ENDOMECHANICALS) IMPLANT
SCISSORS LAP 5X35 DISP (ENDOMECHANICALS) IMPLANT
SET CHOLANGIOGRAPH 5 50 .035 (SET/KITS/TRAYS/PACK) ×2 IMPLANT
SET IRRIG TUBING LAPAROSCOPIC (IRRIGATION / IRRIGATOR) ×2 IMPLANT
SLEEVE ENDOPATH XCEL 5M (ENDOMECHANICALS) ×4 IMPLANT
SPECIMEN JAR SMALL (MISCELLANEOUS) ×2 IMPLANT
STRIP CLOSURE SKIN 1/2X4 (GAUZE/BANDAGES/DRESSINGS) ×2 IMPLANT
SUT MON AB 4-0 PC3 18 (SUTURE) ×2 IMPLANT
TOWEL OR 17X24 6PK STRL BLUE (TOWEL DISPOSABLE) ×2 IMPLANT
TOWEL OR 17X26 10 PK STRL BLUE (TOWEL DISPOSABLE) ×2 IMPLANT
TRAY LAPAROSCOPIC (CUSTOM PROCEDURE TRAY) ×2 IMPLANT
TROCAR XCEL BLUNT TIP 100MML (ENDOMECHANICALS) ×2 IMPLANT
TROCAR XCEL NON-BLD 5MMX100MML (ENDOMECHANICALS) ×2 IMPLANT

## 2011-10-13 NOTE — OR Nursing (Signed)
MD aware of allergies.

## 2011-10-13 NOTE — Anesthesia Postprocedure Evaluation (Signed)
Anesthesia Post Note  Patient: Cynthia Brooks  Procedure(s) Performed: Procedure(s) (LRB): LAPAROSCOPIC CHOLECYSTECTOMY WITH INTRAOPERATIVE CHOLANGIOGRAM (N/A)  Anesthesia type: general  Patient location: PACU  Post pain: Pain level controlled  Post assessment: Patient's Cardiovascular Status Stable  Last Vitals:  Filed Vitals:   10/13/11 1047  BP:   Pulse: 57  Temp:   Resp: 8    Post vital signs: Reviewed and stable  Level of consciousness: sedated  Complications: No apparent anesthesia complications

## 2011-10-13 NOTE — Interval H&P Note (Signed)
History and Physical Interval Note:  10/13/2011 8:01 AM  Cynthia Brooks  has presented today for surgery, with the diagnosis of gallbladder disease  The various methods of treatment have been discussed with the patient and family. After consideration of risks, benefits and other options for treatment, the patient has consented to  Procedure(s) (LRB): LAPAROSCOPIC CHOLECYSTECTOMY WITH INTRAOPERATIVE CHOLANGIOGRAM (N/A) as a surgical intervention .  The patient's history has been reviewed, patient examined, no change in status, stable for surgery.  I have reviewed the patients' chart and labs.  Questions were answered to the patient's satisfaction.     Monta Maiorana A

## 2011-10-13 NOTE — OR Nursing (Signed)
Unable to retrieve earring from right ear preop. Tape placed, patient knows risks.

## 2011-10-13 NOTE — Op Note (Signed)
Laparoscopic Cholecystectomy with IOC Procedure Note  Indications: This patient presents with symptomatic gallbladder disease and will undergo laparoscopic cholecystectomy.  Pre-operative Diagnosis: Calculus of gallbladder without mention of cholecystitis or obstruction  Post-operative Diagnosis: Same  Surgeon: Abigail Miyamoto A   Assistants: 0  Anesthesia: General endotracheal anesthesia  ASA Class: 1  Procedure Details  The patient was seen again in the Holding Room. The risks, benefits, complications, treatment options, and expected outcomes were discussed with the patient. The possibilities of reaction to medication, pulmonary aspiration, perforation of viscus, bleeding, recurrent infection, finding a normal gallbladder, the need for additional procedures, failure to diagnose a condition, the possible need to convert to an open procedure, and creating a complication requiring transfusion or operation were discussed with the patient. The likelihood of improving the patient's symptoms with return to their baseline status is good.  The patient and/or family concurred with the proposed plan, giving informed consent. The site of surgery properly noted. The patient was taken to Operating Room, identified as JOANA NOLTON and the procedure verified as Laparoscopic Cholecystectomy with Intraoperative Cholangiogram. A Time Out was held and the above information confirmed.  Prior to the induction of general anesthesia, antibiotic prophylaxis was administered. General endotracheal anesthesia was then administered and tolerated well. After the induction, the abdomen was prepped with Chloraprep and draped in the sterile fashion. The patient was positioned in the supine position.  Local anesthetic agent was injected into the skin near the umbilicus and an incision made. We dissected down to the abdominal fascia with blunt dissection.  The fascia was incised vertically and we entered the peritoneal cavity  bluntly.  A pursestring suture of 0-Vicryl was placed around the fascial opening.  The Hasson cannula was inserted and secured with the stay suture.  Pneumoperitoneum was then created with CO2 and tolerated well without any adverse changes in the patient's vital signs. An 11-mm port was placed in the subxiphoid position.  Two 5-mm ports were placed in the right upper quadrant. All skin incisions were infiltrated with a local anesthetic agent before making the incision and placing the trocars.   We positioned the patient in reverse Trendelenburg, tilted slightly to the patient's left.  The gallbladder was identified, the fundus grasped and retracted cephalad. Adhesions were lysed bluntly and with the electrocautery where indicated, taking care not to injure any adjacent organs or viscus. The infundibulum was grasped and retracted laterally, exposing the peritoneum overlying the triangle of Calot. This was then divided and exposed in a blunt fashion. A critical view of the cystic duct and cystic artery was obtained.  The cystic duct was clearly identified and bluntly dissected circumferentially. The cystic duct was ligated with a clip distally.   An incision was made in the cystic duct and the [facility] cholangiogram catheter introduced. The catheter was secured using a clip. A cholangiogram was then obtained which showed good visualization of the distal and proximal biliary tree with no sign of filling defects or obstruction.  Contrast flowed easily into the duodenum. The catheter was then removed.   The cystic duct was then ligated with clips and divided. The cystic artery was identified, dissected free, ligated with clips and divided as well.   The gallbladder was dissected from the liver bed in retrograde fashion with the electrocautery. The gallbladder was removed and placed in an Endocatch sac. The liver bed was irrigated and inspected. Hemostasis was achieved with the electrocautery. Copious irrigation was  utilized and was repeatedly aspirated until clear.  The gallbladder and Endocatch sac were then removed through the umbilical port site.  The pursestring suture was used to close the umbilical fascia.    We again inspected the right upper quadrant for hemostasis.  Pneumoperitoneum was released as we removed the trocars.  4-0 Monocryl was used to close the skin.   Benzoin, steri-strips, and clean dressings were applied. The patient was then extubated and brought to the recovery room in stable condition. Instrument, sponge, and needle counts were correct at closure and at the conclusion of the case.   Findings: Normal c-gram without evidence of stone in CBD  Estimated Blood Loss: Minimal         Drains: 0         Specimens: Gallbladder           Complications: None; patient tolerated the procedure well.         Disposition: PACU - hemodynamically stable.         Condition: stable

## 2011-10-13 NOTE — Transfer of Care (Signed)
Immediate Anesthesia Transfer of Care Note  Patient: Cynthia Brooks  Procedure(s) Performed: Procedure(s) (LRB): LAPAROSCOPIC CHOLECYSTECTOMY WITH INTRAOPERATIVE CHOLANGIOGRAM (N/A)  Patient Location: PACU  Anesthesia Type: General  Level of Consciousness: awake, alert  and oriented  Airway & Oxygen Therapy: Patient Spontanous Breathing and Patient connected to nasal cannula oxygen  Post-op Assessment: Report given to PACU RN and Post -op Vital signs reviewed and stable  Post vital signs: Reviewed and stable  Complications: No apparent anesthesia complications

## 2011-10-13 NOTE — Preoperative (Signed)
Beta Blockers   Reason not to administer Beta Blockers:Not Applicable 

## 2011-10-13 NOTE — Anesthesia Preprocedure Evaluation (Addendum)
Anesthesia Evaluation  Patient identified by MRN, date of birth, ID band Patient awake    Reviewed: Allergy & Precautions, H&P , NPO status , Patient's Chart, lab work & pertinent test results  Airway Mallampati: II TM Distance: >3 FB Neck ROM: Full    Dental  (+) Teeth Intact and Dental Advisory Given   Pulmonary former smoker,  breath sounds clear to auscultation  Pulmonary exam normal       Cardiovascular negative cardio ROS  Rhythm:Regular Rate:Normal     Neuro/Psych Anxiety negative neurological ROS     GI/Hepatic negative GI ROS, Neg liver ROS,   Endo/Other  negative endocrine ROS  Renal/GU negative Renal ROS     Musculoskeletal negative musculoskeletal ROS (+)   Abdominal   Peds  Hematology negative hematology ROS (+)   Anesthesia Other Findings   Reproductive/Obstetrics negative OB ROS                          Anesthesia Physical Anesthesia Plan  ASA: II  Anesthesia Plan: General   Post-op Pain Management:    Induction: Intravenous  Airway Management Planned: Oral ETT  Additional Equipment:   Intra-op Plan:   Post-operative Plan: Extubation in OR  Informed Consent: I have reviewed the patients History and Physical, chart, labs and discussed the procedure including the risks, benefits and alternatives for the proposed anesthesia with the patient or authorized representative who has indicated his/her understanding and acceptance.   Dental advisory given  Plan Discussed with: Anesthesiologist, Surgeon and CRNA  Anesthesia Plan Comments:        Anesthesia Quick Evaluation

## 2011-10-14 ENCOUNTER — Encounter (HOSPITAL_COMMUNITY): Payer: Self-pay | Admitting: Surgery

## 2011-10-14 MED ORDER — OXYCODONE-ACETAMINOPHEN 5-325 MG PO TABS: 1.0000 | ORAL_TABLET | ORAL | Status: AC | PRN

## 2011-10-14 NOTE — Progress Notes (Signed)
Patient ID: Cynthia Brooks, female   DOB: 09-12-1980, 31 y.o.   MRN: [id number] POD#1 Doing well Abdomen soft  Plan discharge

## 2011-10-14 NOTE — Discharge Summary (Signed)
Physician Discharge Summary  Patient ID: Cynthia Brooks MRN: [id number] DOB/AGE: 1980-11-30 31 y.o.  Admit date: 10/12/2011 Discharge date: 10/14/2011  Admission Diagnoses:  Discharge Diagnoses:  Active Problems:  * No active hospital problems. *  symptomatic cholelithiasis  Discharged Condition: good  Hospital Course: admitted and taken to OR for Lap chole.  Tolerated well.  Discharged home POD#1  Consults: None  Significant Diagnostic Studies:   Treatments: surgery: lap chole  Discharge Exam: Blood pressure 121/70, pulse 59, temperature 97.4 F (36.3 C), temperature source Oral, resp. rate 16, height 5\' 4"  (1.626 m), weight 156 lb (70.761 kg), SpO2 93.00%. General appearance: alert and no distress Incision/Wound:abdomen soft, dressings dry  Disposition: 01-Home or Self Care  Discharge Orders    Future Orders Please Complete By Expires   Discharge patient        Medication List  As of 10/14/2011 12:18 PM   STOP taking these medications         HYDROcodone-acetaminophen 7.5-750 MG per tablet         TAKE these medications         citalopram 20 MG tablet   Commonly known as: CELEXA   Take 20 mg by mouth daily.      GARCINIA CAMBOGIA-CHROMIUM PO   Take 1 tablet by mouth daily.      oxyCODONE-acetaminophen 5-325 MG per tablet   Commonly known as: PERCOCET   Take 1-2 tablets by mouth every 4 (four) hours as needed.      RASPBERRY PO   Take 1 tablet by mouth 2 (two) times daily. Raspberry keytone 125mg            Follow-up Information    Follow up with [facility], MD in 2 weeks.   Contact information:   [employer] [address], [address] [place] [address] [phone]          Signed: [name] 10/14/2011, 12:18 PM

## 2011-10-18 ENCOUNTER — Other Ambulatory Visit (HOSPITAL_COMMUNITY): Payer: Federal, State, Local not specified - PPO

## 2011-10-18 ENCOUNTER — Telehealth (INDEPENDENT_AMBULATORY_CARE_PROVIDER_SITE_OTHER): Payer: Self-pay

## 2011-10-18 NOTE — Telephone Encounter (Signed)
Pt calling in requesting a refill on her pain med. I called in the automatic refill on Hydrocodone 5/325mg  #30 phoned to [facility] [place] [phone].

## 2011-10-25 ENCOUNTER — Ambulatory Visit (HOSPITAL_COMMUNITY)
Admission: RE | Admit: 2011-10-25 | Payer: Federal, State, Local not specified - PPO | Source: Ambulatory Visit | Admitting: Surgery

## 2011-10-25 ENCOUNTER — Encounter (HOSPITAL_COMMUNITY): Admission: RE | Payer: Self-pay | Source: Ambulatory Visit

## 2011-10-25 SURGERY — LAPAROSCOPIC CHOLECYSTECTOMY
Anesthesia: General

## 2011-10-28 ENCOUNTER — Other Ambulatory Visit (HOSPITAL_COMMUNITY): Payer: Federal, State, Local not specified - PPO

## 2011-11-02 ENCOUNTER — Encounter (INDEPENDENT_AMBULATORY_CARE_PROVIDER_SITE_OTHER): Payer: Federal, State, Local not specified - PPO | Admitting: Surgery

## 2014-02-03 ENCOUNTER — Encounter (HOSPITAL_COMMUNITY): Payer: Self-pay | Admitting: Surgery

## 2015-08-26 ENCOUNTER — Other Ambulatory Visit: Payer: Self-pay | Admitting: Nurse Practitioner

## 2015-08-26 DIAGNOSIS — N644 Mastodynia: Secondary | ICD-10-CM

## 2015-08-26 DIAGNOSIS — N6452 Nipple discharge: Secondary | ICD-10-CM

## 2015-09-02 ENCOUNTER — Other Ambulatory Visit: Payer: Federal, State, Local not specified - PPO

## 2015-09-09 ENCOUNTER — Other Ambulatory Visit: Payer: Self-pay | Admitting: Nurse Practitioner

## 2015-09-09 DIAGNOSIS — Z803 Family history of malignant neoplasm of breast: Secondary | ICD-10-CM

## 2015-09-16 ENCOUNTER — Inpatient Hospital Stay: Admission: RE | Admit: 2015-09-16 | Payer: Federal, State, Local not specified - PPO | Source: Ambulatory Visit

## 2015-10-02 ENCOUNTER — Inpatient Hospital Stay: Admission: RE | Admit: 2015-10-02 | Payer: Federal, State, Local not specified - PPO | Source: Ambulatory Visit

## 2015-10-15 ENCOUNTER — Other Ambulatory Visit: Payer: Federal, State, Local not specified - PPO

## 2016-01-28 DIAGNOSIS — N6452 Nipple discharge: Secondary | ICD-10-CM | POA: Diagnosis not present

## 2016-01-28 DIAGNOSIS — Z114 Encounter for screening for human immunodeficiency virus [HIV]: Secondary | ICD-10-CM | POA: Diagnosis not present

## 2016-01-28 DIAGNOSIS — N63 Unspecified lump in unspecified breast: Secondary | ICD-10-CM | POA: Diagnosis not present

## 2016-01-28 DIAGNOSIS — Z6822 Body mass index (BMI) 22.0-22.9, adult: Secondary | ICD-10-CM | POA: Diagnosis not present

## 2016-01-28 DIAGNOSIS — Z113 Encounter for screening for infections with a predominantly sexual mode of transmission: Secondary | ICD-10-CM | POA: Diagnosis not present

## 2016-01-28 DIAGNOSIS — Z01419 Encounter for gynecological examination (general) (routine) without abnormal findings: Secondary | ICD-10-CM | POA: Diagnosis not present

## 2016-02-02 DIAGNOSIS — N6320 Unspecified lump in the left breast, unspecified quadrant: Secondary | ICD-10-CM | POA: Diagnosis not present

## 2016-02-02 DIAGNOSIS — Z803 Family history of malignant neoplasm of breast: Secondary | ICD-10-CM | POA: Diagnosis not present

## 2016-02-03 DIAGNOSIS — Z803 Family history of malignant neoplasm of breast: Secondary | ICD-10-CM | POA: Diagnosis not present

## 2016-02-03 DIAGNOSIS — N6012 Diffuse cystic mastopathy of left breast: Secondary | ICD-10-CM | POA: Diagnosis not present

## 2016-02-03 DIAGNOSIS — N6320 Unspecified lump in the left breast, unspecified quadrant: Secondary | ICD-10-CM | POA: Diagnosis not present

## 2016-02-19 DIAGNOSIS — M1288 Other specific arthropathies, not elsewhere classified, other specified site: Secondary | ICD-10-CM | POA: Diagnosis not present

## 2016-02-19 DIAGNOSIS — Z6822 Body mass index (BMI) 22.0-22.9, adult: Secondary | ICD-10-CM | POA: Diagnosis not present

## 2016-02-19 DIAGNOSIS — G894 Chronic pain syndrome: Secondary | ICD-10-CM | POA: Diagnosis not present

## 2016-02-19 DIAGNOSIS — M41125 Adolescent idiopathic scoliosis, thoracolumbar region: Secondary | ICD-10-CM | POA: Diagnosis not present

## 2016-05-26 DIAGNOSIS — G894 Chronic pain syndrome: Secondary | ICD-10-CM | POA: Diagnosis not present

## 2016-05-26 DIAGNOSIS — R200 Anesthesia of skin: Secondary | ICD-10-CM | POA: Diagnosis not present

## 2016-05-26 DIAGNOSIS — M1288 Other specific arthropathies, not elsewhere classified, other specified site: Secondary | ICD-10-CM | POA: Diagnosis not present

## 2016-05-26 DIAGNOSIS — M797 Fibromyalgia: Secondary | ICD-10-CM | POA: Diagnosis not present

## 2016-05-30 DIAGNOSIS — J111 Influenza due to unidentified influenza virus with other respiratory manifestations: Secondary | ICD-10-CM | POA: Diagnosis not present

## 2016-05-30 DIAGNOSIS — Z20828 Contact with and (suspected) exposure to other viral communicable diseases: Secondary | ICD-10-CM | POA: Diagnosis not present

## 2016-05-30 DIAGNOSIS — J209 Acute bronchitis, unspecified: Secondary | ICD-10-CM | POA: Diagnosis not present

## 2016-05-30 DIAGNOSIS — R6889 Other general symptoms and signs: Secondary | ICD-10-CM | POA: Diagnosis not present

## 2016-07-19 DIAGNOSIS — M1288 Other specific arthropathies, not elsewhere classified, other specified site: Secondary | ICD-10-CM | POA: Diagnosis not present

## 2016-07-19 DIAGNOSIS — G894 Chronic pain syndrome: Secondary | ICD-10-CM | POA: Diagnosis not present

## 2016-08-15 DIAGNOSIS — J40 Bronchitis, not specified as acute or chronic: Secondary | ICD-10-CM | POA: Diagnosis not present

## 2016-08-15 DIAGNOSIS — F17210 Nicotine dependence, cigarettes, uncomplicated: Secondary | ICD-10-CM | POA: Diagnosis not present

## 2016-08-15 DIAGNOSIS — Z886 Allergy status to analgesic agent status: Secondary | ICD-10-CM | POA: Diagnosis not present

## 2016-08-15 DIAGNOSIS — R0602 Shortness of breath: Secondary | ICD-10-CM | POA: Diagnosis not present

## 2016-08-15 DIAGNOSIS — F419 Anxiety disorder, unspecified: Secondary | ICD-10-CM | POA: Diagnosis not present

## 2016-08-15 DIAGNOSIS — J209 Acute bronchitis, unspecified: Secondary | ICD-10-CM | POA: Diagnosis not present

## 2016-08-15 DIAGNOSIS — R079 Chest pain, unspecified: Secondary | ICD-10-CM | POA: Diagnosis not present

## 2016-08-15 DIAGNOSIS — F329 Major depressive disorder, single episode, unspecified: Secondary | ICD-10-CM | POA: Diagnosis not present

## 2016-08-15 DIAGNOSIS — Z79899 Other long term (current) drug therapy: Secondary | ICD-10-CM | POA: Diagnosis not present

## 2016-08-15 DIAGNOSIS — Z720 Tobacco use: Secondary | ICD-10-CM | POA: Diagnosis not present

## 2016-08-15 DIAGNOSIS — K529 Noninfective gastroenteritis and colitis, unspecified: Secondary | ICD-10-CM | POA: Diagnosis not present

## 2016-09-16 DIAGNOSIS — F411 Generalized anxiety disorder: Secondary | ICD-10-CM | POA: Diagnosis not present

## 2016-09-16 DIAGNOSIS — F332 Major depressive disorder, recurrent severe without psychotic features: Secondary | ICD-10-CM | POA: Diagnosis not present

## 2016-09-16 DIAGNOSIS — M797 Fibromyalgia: Secondary | ICD-10-CM | POA: Diagnosis not present

## 2016-09-16 DIAGNOSIS — F331 Major depressive disorder, recurrent, moderate: Secondary | ICD-10-CM | POA: Diagnosis not present

## 2016-09-22 DIAGNOSIS — F411 Generalized anxiety disorder: Secondary | ICD-10-CM | POA: Diagnosis not present

## 2016-09-22 DIAGNOSIS — F331 Major depressive disorder, recurrent, moderate: Secondary | ICD-10-CM | POA: Diagnosis not present

## 2016-09-22 DIAGNOSIS — F451 Undifferentiated somatoform disorder: Secondary | ICD-10-CM | POA: Diagnosis not present

## 2016-09-29 DIAGNOSIS — M797 Fibromyalgia: Secondary | ICD-10-CM | POA: Diagnosis not present

## 2016-09-29 DIAGNOSIS — F411 Generalized anxiety disorder: Secondary | ICD-10-CM | POA: Diagnosis not present

## 2016-09-29 DIAGNOSIS — F331 Major depressive disorder, recurrent, moderate: Secondary | ICD-10-CM | POA: Diagnosis not present

## 2016-10-06 DIAGNOSIS — F331 Major depressive disorder, recurrent, moderate: Secondary | ICD-10-CM | POA: Diagnosis not present

## 2016-10-06 DIAGNOSIS — M797 Fibromyalgia: Secondary | ICD-10-CM | POA: Diagnosis not present

## 2016-10-06 DIAGNOSIS — F411 Generalized anxiety disorder: Secondary | ICD-10-CM | POA: Diagnosis not present

## 2016-11-17 DIAGNOSIS — Z30433 Encounter for removal and reinsertion of intrauterine contraceptive device: Secondary | ICD-10-CM | POA: Diagnosis not present

## 2016-11-17 DIAGNOSIS — N390 Urinary tract infection, site not specified: Secondary | ICD-10-CM | POA: Diagnosis not present

## 2016-11-18 DIAGNOSIS — F411 Generalized anxiety disorder: Secondary | ICD-10-CM | POA: Diagnosis not present

## 2016-11-18 DIAGNOSIS — M797 Fibromyalgia: Secondary | ICD-10-CM | POA: Diagnosis not present

## 2016-11-18 DIAGNOSIS — F331 Major depressive disorder, recurrent, moderate: Secondary | ICD-10-CM | POA: Diagnosis not present
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
## 2016-11-22 ENCOUNTER — Encounter (HOSPITAL_COMMUNITY): Payer: Self-pay | Admitting: *Deleted

## 2016-11-23 ENCOUNTER — Encounter (HOSPITAL_COMMUNITY): Payer: Self-pay

## 2016-11-24 ENCOUNTER — Ambulatory Visit (HOSPITAL_COMMUNITY): Payer: Federal, State, Local not specified - PPO | Admitting: Anesthesiology

## 2016-11-24 ENCOUNTER — Ambulatory Visit (HOSPITAL_COMMUNITY)
Admission: RE | Admit: 2016-11-24 | Discharge: 2016-11-24 | Disposition: A | Discharge status: Home / Self Care | Payer: Federal, State, Local not specified - PPO | Source: Ambulatory Visit | Attending: Obstetrics and Gynecology | Admitting: Obstetrics and Gynecology

## 2016-11-24 ENCOUNTER — Encounter (HOSPITAL_COMMUNITY)
Admission: RE | Disposition: A | Discharge status: Home / Self Care | Payer: Self-pay | Source: Ambulatory Visit | Attending: Obstetrics and Gynecology

## 2016-11-24 ENCOUNTER — Encounter (HOSPITAL_COMMUNITY): Payer: Self-pay | Admitting: *Deleted

## 2016-11-24 DIAGNOSIS — F419 Anxiety disorder, unspecified: Secondary | ICD-10-CM | POA: Diagnosis not present

## 2016-11-24 DIAGNOSIS — Z884 Allergy status to anesthetic agent status: Secondary | ICD-10-CM | POA: Diagnosis not present

## 2016-11-24 DIAGNOSIS — M419 Scoliosis, unspecified: Secondary | ICD-10-CM | POA: Diagnosis not present

## 2016-11-24 DIAGNOSIS — Z885 Allergy status to narcotic agent status: Secondary | ICD-10-CM | POA: Insufficient documentation

## 2016-11-24 DIAGNOSIS — Z302 Encounter for sterilization: Secondary | ICD-10-CM | POA: Diagnosis not present

## 2016-11-24 DIAGNOSIS — Z800 Family history of malignant neoplasm of digestive organs: Secondary | ICD-10-CM | POA: Diagnosis not present

## 2016-11-24 DIAGNOSIS — Z79899 Other long term (current) drug therapy: Secondary | ICD-10-CM | POA: Insufficient documentation

## 2016-11-24 DIAGNOSIS — M797 Fibromyalgia: Secondary | ICD-10-CM | POA: Insufficient documentation

## 2016-11-24 DIAGNOSIS — Z87891 Personal history of nicotine dependence: Secondary | ICD-10-CM | POA: Diagnosis not present

## 2016-11-24 DIAGNOSIS — Z833 Family history of diabetes mellitus: Secondary | ICD-10-CM | POA: Insufficient documentation

## 2016-11-24 DIAGNOSIS — Z8249 Family history of ischemic heart disease and other diseases of the circulatory system: Secondary | ICD-10-CM | POA: Insufficient documentation

## 2016-11-24 DIAGNOSIS — Z9049 Acquired absence of other specified parts of digestive tract: Secondary | ICD-10-CM | POA: Insufficient documentation

## 2016-11-24 HISTORY — DX: Other chronic pain: G89.29

## 2016-11-24 HISTORY — DX: Dyspnea, unspecified: R06.00

## 2016-11-24 HISTORY — DX: Headache, unspecified: R51.9

## 2016-11-24 HISTORY — DX: Dorsalgia, unspecified: M54.9

## 2016-11-24 HISTORY — DX: Fibromyalgia: M79.7

## 2016-11-24 HISTORY — DX: Nausea with vomiting, unspecified: R11.2

## 2016-11-24 HISTORY — DX: Major depressive disorder, single episode, unspecified: F32.9

## 2016-11-24 HISTORY — DX: Cystitis, unspecified without hematuria: N30.90

## 2016-11-24 HISTORY — DX: Depression, unspecified: F32.A

## 2016-11-24 HISTORY — DX: Unspecified osteoarthritis, unspecified site: M19.90

## 2016-11-24 HISTORY — DX: Headache: R51

## 2016-11-24 HISTORY — PX: LAPAROSCOPIC TUBAL LIGATION: SHX1937

## 2016-11-24 HISTORY — DX: Other specified postprocedural states: Z98.890

## 2016-11-24 HISTORY — DX: Gastro-esophageal reflux disease without esophagitis: K21.9

## 2016-11-24 LAB — TYPE AND SCREEN
ABO/RH(D): A POS
Antibody Screen: NEGATIVE

## 2016-11-24 LAB — CBC
HCT: 45.2 % (ref 36.0–46.0)
Hemoglobin: 15.7 g/dL — ABNORMAL HIGH (ref 12.0–15.0)
MCH: 34.2 pg — ABNORMAL HIGH (ref 26.0–34.0)
MCHC: 34.7 g/dL (ref 30.0–36.0)
MCV: 98.5 fL (ref 78.0–100.0)
Platelets: 248 10*3/uL (ref 150–400)
RBC: 4.59 MIL/uL (ref 3.87–5.11)
RDW: 12.5 % (ref 11.5–15.5)
WBC: 5.3 10*3/uL (ref 4.0–10.5)

## 2016-11-24 LAB — PREGNANCY, URINE: Preg Test, Ur: NEGATIVE

## 2016-11-24 LAB — ABO/RH: ABO/RH(D): A POS

## 2016-11-24 SURGERY — LIGATION, FALLOPIAN TUBE, LAPAROSCOPIC
Anesthesia: General | Site: Abdomen | Laterality: Bilateral

## 2016-11-24 MED ORDER — OXYCODONE HCL 5 MG/5ML PO SOLN: 5.0000 mg | Freq: Once | ORAL | Status: DC | PRN

## 2016-11-24 MED ORDER — DEXAMETHASONE SODIUM PHOSPHATE 10 MG/ML IJ SOLN
INTRAMUSCULAR | Status: DC | PRN
  Administered 2016-11-24: 4 mg via INTRAVENOUS

## 2016-11-24 MED ORDER — ROCURONIUM BROMIDE 100 MG/10ML IV SOLN
INTRAVENOUS | Status: DC | PRN
Start: 2016-11-24 — End: 2016-11-24
  Administered 2016-11-24: 40 mg via INTRAVENOUS

## 2016-11-24 MED ORDER — IBUPROFEN 600 MG PO TABS: 600.0000 mg | ORAL_TABLET | Freq: Four times a day (QID) | ORAL | 0 refills | Status: AC | PRN

## 2016-11-24 MED ORDER — LACTATED RINGERS IV SOLN
INTRAVENOUS | Status: DC
  Administered 2016-11-24 (×2): via INTRAVENOUS

## 2016-11-24 MED ORDER — CEFOTETAN DISODIUM-DEXTROSE 2-2.08 GM-% IV SOLR
INTRAVENOUS | Status: AC
  Filled 2016-11-24: qty 50

## 2016-11-24 MED ORDER — FENTANYL CITRATE (PF) 100 MCG/2ML IJ SOLN
INTRAMUSCULAR | Status: AC
  Filled 2016-11-24: qty 2

## 2016-11-24 MED ORDER — HYDROMORPHONE HCL 1 MG/ML IJ SOLN
0.2500 mg | INTRAMUSCULAR | Status: DC | PRN
  Administered 2016-11-24 (×4): 0.5 mg via INTRAVENOUS

## 2016-11-24 MED ORDER — OXYCODONE HCL 5 MG PO TABS: 5.0000 mg | ORAL_TABLET | Freq: Once | ORAL | Status: DC | PRN

## 2016-11-24 MED ORDER — LIDOCAINE HCL (CARDIAC) 20 MG/ML IV SOLN
INTRAVENOUS | Status: AC
Start: 2016-11-24 — End: 2016-11-24
  Filled 2016-11-24: qty 5

## 2016-11-24 MED ORDER — PROPOFOL 10 MG/ML IV BOLUS
INTRAVENOUS | Status: AC
  Filled 2016-11-24: qty 20

## 2016-11-24 MED ORDER — PROMETHAZINE HCL 25 MG/ML IJ SOLN: 6.2500 mg | INTRAMUSCULAR | Status: DC | PRN

## 2016-11-24 MED ORDER — SCOPOLAMINE 1 MG/3DAYS TD PT72
1.0000 | MEDICATED_PATCH | Freq: Once | TRANSDERMAL | Status: DC
  Administered 2016-11-24: 1.5 mg via TRANSDERMAL

## 2016-11-24 MED ORDER — HYDROMORPHONE HCL 1 MG/ML IJ SOLN
INTRAMUSCULAR | Status: AC
  Administered 2016-11-24: 0.5 mg via INTRAVENOUS
  Filled 2016-11-24: qty 1

## 2016-11-24 MED ORDER — LIDOCAINE HCL 1 % IJ SOLN
INTRAMUSCULAR | Status: AC
  Filled 2016-11-24: qty 20

## 2016-11-24 MED ORDER — ONDANSETRON HCL 4 MG/2ML IJ SOLN
INTRAMUSCULAR | Status: DC | PRN
  Administered 2016-11-24: 4 mg via INTRAVENOUS

## 2016-11-24 MED ORDER — OXYCODONE-ACETAMINOPHEN 5-325 MG PO TABS: 2.0000 | ORAL_TABLET | ORAL | 0 refills | Status: DC | PRN

## 2016-11-24 MED ORDER — ONDANSETRON HCL 4 MG/2ML IJ SOLN
INTRAMUSCULAR | Status: AC
  Filled 2016-11-24: qty 2

## 2016-11-24 MED ORDER — ROCURONIUM BROMIDE 100 MG/10ML IV SOLN
INTRAVENOUS | Status: AC
  Filled 2016-11-24: qty 1

## 2016-11-24 MED ORDER — MIDAZOLAM HCL 2 MG/2ML IJ SOLN
INTRAMUSCULAR | Status: DC | PRN
Start: 2016-11-24 — End: 2016-11-24
  Administered 2016-11-24: 2 mg via INTRAVENOUS

## 2016-11-24 MED ORDER — FENTANYL CITRATE (PF) 100 MCG/2ML IJ SOLN
INTRAMUSCULAR | Status: DC | PRN
  Administered 2016-11-24 (×3): 50 ug via INTRAVENOUS
  Administered 2016-11-24: 100 ug via INTRAVENOUS
  Administered 2016-11-24: 50 ug via INTRAVENOUS

## 2016-11-24 MED ORDER — KETOROLAC TROMETHAMINE 30 MG/ML IJ SOLN
INTRAMUSCULAR | Status: DC | PRN
  Administered 2016-11-24: 30 mg via INTRAVENOUS

## 2016-11-24 MED ORDER — CEFOTETAN DISODIUM-DEXTROSE 2-2.08 GM-% IV SOLR
2.0000 g | INTRAVENOUS | Status: AC
  Administered 2016-11-24: 2 g via INTRAVENOUS

## 2016-11-24 MED ORDER — MEPERIDINE HCL 25 MG/ML IJ SOLN: 6.2500 mg | INTRAMUSCULAR | Status: DC | PRN

## 2016-11-24 MED ORDER — PROPOFOL 10 MG/ML IV BOLUS
INTRAVENOUS | Status: DC | PRN
  Administered 2016-11-24: 150 mg via INTRAVENOUS

## 2016-11-24 MED ORDER — SCOPOLAMINE 1 MG/3DAYS TD PT72
MEDICATED_PATCH | TRANSDERMAL | Status: AC
  Filled 2016-11-24: qty 1

## 2016-11-24 MED ORDER — SUGAMMADEX SODIUM 200 MG/2ML IV SOLN
INTRAVENOUS | Status: DC | PRN
  Administered 2016-11-24: 118 mg via INTRAVENOUS

## 2016-11-24 MED ORDER — MIDAZOLAM HCL 2 MG/2ML IJ SOLN
INTRAMUSCULAR | Status: AC
  Filled 2016-11-24: qty 2

## 2016-11-24 MED ORDER — LIDOCAINE HCL (PF) 1 % IJ SOLN
INTRAMUSCULAR | Status: DC | PRN
  Administered 2016-11-24: 3 mL

## 2016-11-24 SURGICAL SUPPLY — 26 items
CATH ROBINSON RED A/P 16FR (CATHETERS) ×2 IMPLANT
CLIP FILSHIE TUBAL LIGA STRL (Clip) ×2 IMPLANT
CLOTH BEACON ORANGE TIMEOUT ST (SAFETY) ×2 IMPLANT
DERMABOND ADVANCED (GAUZE/BANDAGES/DRESSINGS) ×1
DERMABOND ADVANCED .7 DNX12 (GAUZE/BANDAGES/DRESSINGS) ×1 IMPLANT
DRSG OPSITE POSTOP 3X4 (GAUZE/BANDAGES/DRESSINGS) ×2 IMPLANT
DURAPREP 26ML APPLICATOR (WOUND CARE) ×4 IMPLANT
GLOVE BIO SURGEON STRL SZ 6.5 (GLOVE) ×2 IMPLANT
GLOVE BIOGEL PI IND STRL 7.0 (GLOVE) ×2 IMPLANT
GLOVE BIOGEL PI INDICATOR 7.0 (GLOVE) ×2
GOWN STRL REUS W/TWL LRG LVL3 (GOWN DISPOSABLE) ×4 IMPLANT
PACK LAPAROSCOPY BASIN (CUSTOM PROCEDURE TRAY) ×2 IMPLANT
PACK TRENDGUARD 450 HYBRID PRO (MISCELLANEOUS) ×1 IMPLANT
PACK TRENDGUARD 600 HYBRD PROC (MISCELLANEOUS) IMPLANT
PROTECTOR NERVE ULNAR (MISCELLANEOUS) ×4 IMPLANT
SET IRRIG TUBING LAPAROSCOPIC (IRRIGATION / IRRIGATOR) IMPLANT
SLEEVE XCEL OPT CAN 5 100 (ENDOMECHANICALS) ×2 IMPLANT
SUT VIC AB 3-0 PS2 18 (SUTURE) ×1
SUT VIC AB 3-0 PS2 18XBRD (SUTURE) ×1 IMPLANT
SUT VICRYL 0 UR6 27IN ABS (SUTURE) ×2 IMPLANT
TOWEL OR 17X24 6PK STRL BLUE (TOWEL DISPOSABLE) ×4 IMPLANT
TRENDGUARD 450 HYBRID PRO PACK (MISCELLANEOUS) ×2
TRENDGUARD 600 HYBRID PROC PK (MISCELLANEOUS)
TROCAR OPTI TIP 5M 100M (ENDOMECHANICALS) IMPLANT
TROCAR XCEL NON-BLD 11X100MML (ENDOMECHANICALS) ×2 IMPLANT
WARMER LAPAROSCOPE (MISCELLANEOUS) ×2 IMPLANT

## 2016-11-24 NOTE — Anesthesia Preprocedure Evaluation (Signed)
Anesthesia Evaluation  Patient identified by MRN, date of birth, ID band Patient awake    Reviewed: Allergy & Precautions, H&P , NPO status , Patient's Chart, lab work & pertinent test results  History of Anesthesia Complications (+) PONV and history of anesthetic complications  Airway Mallampati: II  TM Distance: >3 FB Neck ROM: Full    Dental  (+) Teeth Intact, Dental Advisory Given   Pulmonary Current Smoker, former smoker,    Pulmonary exam normal breath sounds clear to auscultation       Cardiovascular negative cardio ROS Normal cardiovascular exam Rhythm:Regular Rate:Normal     Neuro/Psych  Headaches, Anxiety Depression negative neurological ROS     GI/Hepatic negative GI ROS, Neg liver ROS, GERD  Medicated,  Endo/Other  negative endocrine ROS  Renal/GU negative Renal ROS     Musculoskeletal negative musculoskeletal ROS (+) Arthritis , Fibromyalgia -  Abdominal   Peds  Hematology negative hematology ROS (+)   Anesthesia Other Findings   Reproductive/Obstetrics negative OB ROS                             Anesthesia Physical  Anesthesia Plan  ASA: II  Anesthesia Plan: General   Post-op Pain Management:    Induction: Intravenous  PONV Risk Score and Plan: 2 and Ondansetron, Dexamethasone, Midazolam and Treatment may vary due to age or medical condition  Airway Management Planned: Oral ETT  Additional Equipment:   Intra-op Plan:   Post-operative Plan: Extubation in OR  Informed Consent: I have reviewed the patients History and Physical, chart, labs and discussed the procedure including the risks, benefits and alternatives for the proposed anesthesia with the patient or authorized representative who has indicated his/her understanding and acceptance.   Dental advisory given  Plan Discussed with: Anesthesiologist, Surgeon and CRNA  Anesthesia Plan Comments:          Anesthesia Quick Evaluation

## 2016-11-24 NOTE — Transfer of Care (Signed)
Immediate Anesthesia Transfer of Care Note  Patient: Cynthia Brooks  Procedure(s) Performed: Procedure(s): LAPAROSCOPIC TUBAL LIGATION with Filshie clips (Bilateral)  Patient Location: PACU  Anesthesia Type:General  Level of Consciousness: awake, alert  and oriented  Airway & Oxygen Therapy: Patient Spontanous Breathing and Patient connected to nasal cannula oxygen  Post-op Assessment: Report given to RN and Post -op Vital signs reviewed and stable  Post vital signs: Reviewed and stable  Last Vitals:  Vitals:   11/23/16 1121 11/24/16 0830  BP: 120/68 (P) 126/78  Pulse: 63 (P) 62  Resp: 16 (P) 20  Temp: 36.6 C (P) 36.7 C  SpO2: 100% (P) 100%    Last Pain:  Vitals:   11/23/16 1121  TempSrc: Oral      Patients Stated Pain Goal: 3 (11/23/16 1121)  Complications: No apparent anesthesia complications

## 2016-11-24 NOTE — Anesthesia Procedure Notes (Signed)
Procedure Name: Intubation Date/Time: 11/24/2016 7:34 AM Performed by: Louretta Tantillo, Sheron Nightingale Pre-anesthesia Checklist: Patient identified, Emergency Drugs available, Suction available, Patient being monitored and Timeout performed Patient Re-evaluated:Patient Re-evaluated prior to induction Oxygen Delivery Method: Circle system utilized Induction Type: Combination inhalational/ intravenous induction Ventilation: Mask ventilation without difficulty Laryngoscope Size: Mac and 3 Grade View: Grade III Tube size: 7.0 mm Number of attempts: 1 Placement Confirmation: ETT inserted through vocal cords under direct vision,  positive ETCO2 and breath sounds checked- equal and bilateral Secured at: 21 cm Dental Injury: Teeth and Oropharynx as per pre-operative assessment

## 2016-11-24 NOTE — Discharge Instructions (Signed)
[facility] office 2-3 weeks for postop appointment.  Call the office [phone] for an appointment.  Personal Hygiene: Use pads not tampons x 1week You may shower, no tub baths or pools for 2-3 weeks Wipe from front to back when using restroom  Activity: Do not drive or operate any equipment for 24 hrs.   Do not rest in bed all day Walking is encouraged Walk up and down stairs slowly You may return to your normal activity in 1-2 days  Sexual Activity:  No intercourse for 2 weeks after the procedure.  Diet: Eat a light meal as desired this evening.  You may resume your usual diet tomorrow.  Return to work:  You may resume your work activities after 5-7 days  What to expect:  Expect to have vaginal bleeding/discharge for 2-3 days and spotting for 10-14 days.  It is not unusual to have soreness for 1-2 weeks.  You may have a slight burning sensation when you urinate for the first few days.  You may start your menses in 2-6 weeks.  Mild cramps may continue for a couple of days.    Call your doctor:   Excessive bleeding, saturating a pad every hour Inability to urinate 6 hours after discharge Pain not relieved with pain medications Fever of 100.4 or greater    Post Anesthesia Home Care Instructions  Activity: Get plenty of rest for the remainder of the day. A responsible individual must stay with you for 24 hours following the procedure.  For the next 24 hours, DO NOT: -Drive a car -Advertising copywriter -Drink alcoholic beverages -Take any medication unless instructed by your physician -Make any legal decisions or sign important papers.  Meals: Start with liquid foods such as gelatin or soup. Progress to regular foods as tolerated. Avoid greasy, spicy, heavy foods. If nausea and/or vomiting occur, drink only clear liquids until the nausea and/or vomiting subsides. Call your physician if vomiting continues.  Special Instructions/Symptoms: Your throat may feel dry or sore from the anesthesia  or the breathing tube placed in your throat during surgery. If this causes discomfort, gargle with warm salt water. The discomfort should disappear within 24 hours.  If you had a scopolamine patch placed behind your ear for the management of post- operative nausea and/or vomiting:  1. The medication in the patch is effective for 72 hours, after which it should be removed.  Wrap patch in a tissue and discard in the trash. Wash hands thoroughly with soap and water. 2. You may remove the patch earlier than 72 hours if you experience unpleasant side effects which may include dry mouth, dizziness or visual disturbances. 3. Avoid touching the patch. Wash your hands with soap and water after contact with the patch.

## 2016-11-24 NOTE — Anesthesia Postprocedure Evaluation (Signed)
Anesthesia Post Note  Patient: Cynthia Brooks  Procedure(s) Performed: Procedure(s) (LRB): LAPAROSCOPIC TUBAL LIGATION with Filshie clips (Bilateral)     Patient location during evaluation: PACU Anesthesia Type: General Level of consciousness: awake and alert Pain management: pain level controlled Vital Signs Assessment: post-procedure vital signs reviewed and stable Respiratory status: spontaneous breathing, nonlabored ventilation and respiratory function stable Cardiovascular status: blood pressure returned to baseline and stable Postop Assessment: no signs of nausea or vomiting Anesthetic complications: no    Last Vitals:  Vitals:   11/24/16 1000 11/24/16 1003  BP:    Pulse: 60 63  Resp: 16 15  Temp:    SpO2: 95% 98%    Last Pain:  Vitals:   11/24/16 1000  TempSrc:   PainSc: 3    Pain Goal: Patients Stated Pain Goal: 3 (11/24/16 1000)               Lowella Curb

## 2016-11-24 NOTE — Anesthesia Procedure Notes (Signed)
Procedure Name: Intubation Date/Time: 11/24/2016 7:34 AM Performed by: Meily Glowacki, Sheron Nightingale Pre-anesthesia Checklist: Patient identified, Emergency Drugs available, Suction available, Patient being monitored and Timeout performed Patient Re-evaluated:Patient Re-evaluated prior to induction Oxygen Delivery Method: Circle system utilized Preoxygenation: Pre-oxygenation with 100% oxygen Induction Type: IV induction Ventilation: Mask ventilation without difficulty Laryngoscope Size: Mac Grade View: Grade III Tube type: Oral Laser Tube: Cuffed inflated with minimal occlusive pressure - saline Tube size: 7.0 mm Number of attempts: 2

## 2016-11-27 ENCOUNTER — Encounter (HOSPITAL_COMMUNITY): Payer: Self-pay | Admitting: Obstetrics and Gynecology

## 2016-11-29 NOTE — Op Note (Signed)
NAMESOPHIANA, SCALISI                   ACCOUNT NO.:  [id number]  MEDICAL RECORD NO.:  [id number]  LOCATION:                                 FACILITY:  PHYSICIAN:  Zelphia Cairo, MD         DATE OF BIRTH:  DATE OF PROCEDURE:  11/24/2016 DATE OF DISCHARGE:                              OPERATIVE REPORT   PREOPERATIVE DIAGNOSIS:  Desires permanent sterilization.  POSTOPERATIVE DIAGNOSIS:  Desires permanent sterilization.  PROCEDURE:  Laparoscopic bilateral tubal ligation with Filshie clips.  SURGEON:  Zelphia Cairo, MD  ANESTHESIA:  General.  COMPLICATIONS:  None.  CONDITION:  To recovery room.  DESCRIPTION OF PROCEDURE:  The patient was taken to the operating room. After informed consent was obtained, she was given anesthesia, placed in the dorsal lithotomy position using Allen stirrups.  She was prepped and draped in sterile fashion and her bladder was sterilely emptied with a red rubber catheter.  A time-out was performed and a bivalve speculum was placed in the vagina.  A Hulka clamp was placed on the cervix. Speculum was removed and our attention was turned to the abdomen. Lidocaine 1% was used to provide local anesthesia at the site of our infraumbilical incision.  Scalpel was used to make a skin incision and this was extended bluntly to the level of the fascia using a Kelly clamp.  Optical trocar was then inserted under direct visualization. Once intraperitoneal placement was confirmed, CO2 was turned on and a survey was performed.  Uterus, bilateral tubes, and ovaries appeared normal.  A Filshie clip was placed on bilateral tubes.  All instruments and trocars were then removed from the abdomen.  CO2 gas was released. Deep stitch was placed in the infraumbilical incision.  The skin was closed with Vicryl.  Skin glue was applied.  Hulka clamp was removed. She was taken to the recovery room in stable condition.     Zelphia Cairo, MD     GA/MEDQ  D:  11/28/2016   T:  11/29/2016  Job:  [id number]

## 2016-12-01 ENCOUNTER — Encounter (HOSPITAL_COMMUNITY): Payer: Self-pay | Admitting: *Deleted

## 2016-12-01 ENCOUNTER — Inpatient Hospital Stay (HOSPITAL_COMMUNITY)
Admission: AD | Admit: 2016-12-01 | Discharge: 2016-12-01 | Disposition: A | Discharge status: Home / Self Care | Payer: Federal, State, Local not specified - PPO | Source: Ambulatory Visit | Attending: Obstetrics and Gynecology | Admitting: Obstetrics and Gynecology

## 2016-12-01 ENCOUNTER — Inpatient Hospital Stay (HOSPITAL_COMMUNITY): Payer: Federal, State, Local not specified - PPO

## 2016-12-01 DIAGNOSIS — F329 Major depressive disorder, single episode, unspecified: Secondary | ICD-10-CM | POA: Insufficient documentation

## 2016-12-01 DIAGNOSIS — Z9889 Other specified postprocedural states: Secondary | ICD-10-CM | POA: Diagnosis not present

## 2016-12-01 DIAGNOSIS — F17210 Nicotine dependence, cigarettes, uncomplicated: Secondary | ICD-10-CM | POA: Insufficient documentation

## 2016-12-01 DIAGNOSIS — Z808 Family history of malignant neoplasm of other organs or systems: Secondary | ICD-10-CM | POA: Insufficient documentation

## 2016-12-01 DIAGNOSIS — F419 Anxiety disorder, unspecified: Secondary | ICD-10-CM | POA: Diagnosis not present

## 2016-12-01 DIAGNOSIS — Z8279 Family history of other congenital malformations, deformations and chromosomal abnormalities: Secondary | ICD-10-CM | POA: Diagnosis not present

## 2016-12-01 DIAGNOSIS — Z885 Allergy status to narcotic agent status: Secondary | ICD-10-CM | POA: Diagnosis not present

## 2016-12-01 DIAGNOSIS — M797 Fibromyalgia: Secondary | ICD-10-CM | POA: Insufficient documentation

## 2016-12-01 DIAGNOSIS — Z833 Family history of diabetes mellitus: Secondary | ICD-10-CM | POA: Insufficient documentation

## 2016-12-01 DIAGNOSIS — Z8619 Personal history of other infectious and parasitic diseases: Secondary | ICD-10-CM | POA: Diagnosis not present

## 2016-12-01 DIAGNOSIS — R109 Unspecified abdominal pain: Secondary | ICD-10-CM | POA: Diagnosis not present

## 2016-12-01 DIAGNOSIS — R1031 Right lower quadrant pain: Secondary | ICD-10-CM | POA: Insufficient documentation

## 2016-12-01 DIAGNOSIS — Z8249 Family history of ischemic heart disease and other diseases of the circulatory system: Secondary | ICD-10-CM | POA: Insufficient documentation

## 2016-12-01 DIAGNOSIS — Z9851 Tubal ligation status: Secondary | ICD-10-CM | POA: Diagnosis not present

## 2016-12-01 DIAGNOSIS — Z9049 Acquired absence of other specified parts of digestive tract: Secondary | ICD-10-CM | POA: Diagnosis not present

## 2016-12-01 DIAGNOSIS — G8918 Other acute postprocedural pain: Secondary | ICD-10-CM | POA: Diagnosis not present

## 2016-12-01 LAB — CBC WITH DIFFERENTIAL/PLATELET
Basophils Absolute: 0 10*3/uL (ref 0.0–0.1)
Basophils Relative: 0 %
Eosinophils Absolute: 0.1 10*3/uL (ref 0.0–0.7)
Eosinophils Relative: 2 %
HCT: 42.6 % (ref 36.0–46.0)
Hemoglobin: 14.9 g/dL (ref 12.0–15.0)
Lymphocytes Relative: 25 %
Lymphs Abs: 1.5 10*3/uL (ref 0.7–4.0)
MCH: 33.9 pg (ref 26.0–34.0)
MCHC: 35 g/dL (ref 30.0–36.0)
MCV: 97 fL (ref 78.0–100.0)
Monocytes Absolute: 0.3 10*3/uL (ref 0.1–1.0)
Monocytes Relative: 4 %
Neutro Abs: 4.1 10*3/uL (ref 1.7–7.7)
Neutrophils Relative %: 69 %
Platelets: 236 10*3/uL (ref 150–400)
RBC: 4.39 MIL/uL (ref 3.87–5.11)
RDW: 12.1 % (ref 11.5–15.5)
WBC: 6 10*3/uL (ref 4.0–10.5)

## 2016-12-01 LAB — URINALYSIS, ROUTINE W REFLEX MICROSCOPIC
Bilirubin Urine: NEGATIVE
Glucose, UA: NEGATIVE mg/dL
Hgb urine dipstick: NEGATIVE
Ketones, ur: 20 mg/dL — AB
Leukocytes, UA: NEGATIVE
Nitrite: NEGATIVE
Protein, ur: 30 mg/dL — AB
Specific Gravity, Urine: 1.019 (ref 1.005–1.030)
pH: 9 — ABNORMAL HIGH (ref 5.0–8.0)

## 2016-12-01 MED ORDER — IOPAMIDOL (ISOVUE-300) INJECTION 61%
100.0000 mL | Freq: Once | INTRAVENOUS | Status: AC | PRN
  Administered 2016-12-01: 100 mL via INTRAVENOUS

## 2016-12-01 MED ORDER — SODIUM CHLORIDE 0.9 % IV SOLN
INTRAVENOUS | Status: DC
  Administered 2016-12-01: 12:00:00 via INTRAVENOUS

## 2016-12-01 MED ORDER — OXYCODONE-ACETAMINOPHEN 5-325 MG PO TABS: 1.0000 | ORAL_TABLET | Freq: Four times a day (QID) | ORAL | 0 refills | Status: AC | PRN

## 2016-12-01 MED ORDER — OXYCODONE-ACETAMINOPHEN 5-325 MG PO TABS
2.0000 | ORAL_TABLET | Freq: Once | ORAL | Status: AC
  Administered 2016-12-01: 2 via ORAL
  Filled 2016-12-01: qty 2

## 2016-12-01 MED ORDER — IOPAMIDOL (ISOVUE-300) INJECTION 61%
30.0000 mL | Freq: Once | INTRAVENOUS | Status: AC | PRN
  Administered 2016-12-01: 30 mL via ORAL

## 2016-12-01 MED ORDER — SODIUM CHLORIDE 0.9 % IV SOLN
8.0000 mg | Freq: Once | INTRAVENOUS | Status: AC
  Administered 2016-12-01: 8 mg via INTRAVENOUS
  Filled 2016-12-01: qty 4
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
## 2016-12-01 NOTE — MAU Note (Signed)
Pt had Tubal ligation last week . C/o RLQ that has progressively gotten worse over the past week.

## 2016-12-01 NOTE — Discharge Instructions (Signed)
Laparoscopic Tubal Ligation, Care After °Refer to this sheet in the next few weeks. These instructions provide you with information about caring for yourself after your procedure. Your health care provider may also give you more specific instructions. Your treatment has been planned according to current medical practices, but problems sometimes occur. Call your health care provider if you have any problems or questions after your procedure. °What can I expect after the procedure? °After the procedure, it is common to have: °· A sore throat. °· Discomfort in your shoulder. °· Mild discomfort or cramping in your abdomen. °· Gas pains. °· Pain or soreness in the area where the surgical cut (incision) was made. °· A bloated feeling. °· Tiredness. °· Nausea. °· Vomiting. ° °Follow these instructions at home: °Medicines °· Take over-the-counter and prescription medicines only as told by your health care provider. °· Do not take aspirin because it can cause bleeding. °· Do not drive or operate heavy machinery while taking prescription pain medicine. °Activity °· Rest for the rest of the day. °· Return to your normal activities as told by your health care provider. Ask your health care provider what activities are safe for you. °Incision care ° °· Follow instructions from your health care provider about how to take care of your incision. Make sure you: °? Wash your hands with soap and water before you change your bandage (dressing). If soap and water are not available, use hand sanitizer. °? Change your dressing as told by your health care provider. °? Leave stitches (sutures) in place. They may need to stay in place for 2 weeks or longer. °· Check your incision area every day for signs of infection. Check for: °? More redness, swelling, or pain. °? More fluid or blood. °? Warmth. °? Pus or a bad smell. °Other Instructions °· Do not take baths, swim, or use a hot tub until your health care provider approves. You may take  showers. °· Keep all follow-up visits as told by your health care provider. This is important. °· Have someone help you with your daily household tasks for the first few days. °Contact a health care provider if: °· You have more redness, swelling, or pain around your incision. °· Your incision feels warm to the touch. °· You have pus or a bad smell coming from your incision. °· The edges of your incision break open after the sutures have been removed. °· Your pain does not improve after 2-3 days. °· You have a rash. °· You repeatedly become dizzy or light-headed. °· Your pain medicine is not helping. °· You are constipated. °Get help right away if: °· You have a fever. °· You faint. °· You have increasing pain in your abdomen. °· You have severe pain in one or both of your shoulders. °· You have fluid or blood coming from your sutures or from your vagina. °· You have shortness of breath or difficulty breathing. °· You have chest pain or leg pain. °· You have ongoing nausea, vomiting, or diarrhea. °This information is not intended to replace advice given to you by your health care provider. Make sure you discuss any questions you have with your health care provider. °Document Released: 10/08/2004 Document Revised: 08/24/2015 Document Reviewed: 03/01/2015 °Elsevier Interactive Patient Education © 2018 [employer] ° °

## 2016-12-14 DIAGNOSIS — F900 Attention-deficit hyperactivity disorder, predominantly inattentive type: Secondary | ICD-10-CM | POA: Diagnosis not present

## 2016-12-14 DIAGNOSIS — F331 Major depressive disorder, recurrent, moderate: Secondary | ICD-10-CM | POA: Diagnosis not present

## 2016-12-14 DIAGNOSIS — M797 Fibromyalgia: Secondary | ICD-10-CM | POA: Diagnosis not present

## 2016-12-14 DIAGNOSIS — F411 Generalized anxiety disorder: Secondary | ICD-10-CM | POA: Diagnosis not present

## 2017-01-11 DIAGNOSIS — F900 Attention-deficit hyperactivity disorder, predominantly inattentive type: Secondary | ICD-10-CM | POA: Diagnosis not present

## 2017-01-11 DIAGNOSIS — F411 Generalized anxiety disorder: Secondary | ICD-10-CM | POA: Diagnosis not present

## 2017-02-02 DIAGNOSIS — G4700 Insomnia, unspecified: Secondary | ICD-10-CM | POA: Diagnosis not present

## 2017-02-02 DIAGNOSIS — F331 Major depressive disorder, recurrent, moderate: Secondary | ICD-10-CM | POA: Diagnosis not present

## 2017-02-02 DIAGNOSIS — F900 Attention-deficit hyperactivity disorder, predominantly inattentive type: Secondary | ICD-10-CM | POA: Diagnosis not present

## 2017-02-02 DIAGNOSIS — F411 Generalized anxiety disorder: Secondary | ICD-10-CM | POA: Diagnosis not present

## 2017-02-20 DIAGNOSIS — Z3202 Encounter for pregnancy test, result negative: Secondary | ICD-10-CM | POA: Diagnosis not present

## 2017-02-20 DIAGNOSIS — F900 Attention-deficit hyperactivity disorder, predominantly inattentive type: Secondary | ICD-10-CM | POA: Diagnosis not present

## 2017-02-20 DIAGNOSIS — Z113 Encounter for screening for infections with a predominantly sexual mode of transmission: Secondary | ICD-10-CM | POA: Diagnosis not present

## 2017-02-20 DIAGNOSIS — F411 Generalized anxiety disorder: Secondary | ICD-10-CM | POA: Diagnosis not present

## 2017-02-20 DIAGNOSIS — R102 Pelvic and perineal pain: Secondary | ICD-10-CM | POA: Diagnosis not present

## 2017-02-20 DIAGNOSIS — N760 Acute vaginitis: Secondary | ICD-10-CM | POA: Diagnosis not present

## 2017-02-20 DIAGNOSIS — N390 Urinary tract infection, site not specified: Secondary | ICD-10-CM | POA: Diagnosis not present

## 2017-02-20 DIAGNOSIS — R82998 Other abnormal findings in urine: Secondary | ICD-10-CM | POA: Diagnosis not present

## 2017-02-20 DIAGNOSIS — F331 Major depressive disorder, recurrent, moderate: Secondary | ICD-10-CM | POA: Diagnosis not present

## 2017-02-20 DIAGNOSIS — G4700 Insomnia, unspecified: Secondary | ICD-10-CM | POA: Diagnosis not present

## 2017-03-07 DIAGNOSIS — Z79891 Long term (current) use of opiate analgesic: Secondary | ICD-10-CM | POA: Diagnosis not present

## 2017-03-14 DIAGNOSIS — Z79891 Long term (current) use of opiate analgesic: Secondary | ICD-10-CM | POA: Diagnosis not present

## 2017-03-20 DIAGNOSIS — Z6821 Body mass index (BMI) 21.0-21.9, adult: Secondary | ICD-10-CM | POA: Diagnosis not present

## 2017-03-20 DIAGNOSIS — R109 Unspecified abdominal pain: Secondary | ICD-10-CM | POA: Diagnosis not present

## 2017-03-20 DIAGNOSIS — E875 Hyperkalemia: Secondary | ICD-10-CM | POA: Diagnosis not present

## 2017-03-21 DIAGNOSIS — Z79891 Long term (current) use of opiate analgesic: Secondary | ICD-10-CM | POA: Diagnosis not present

## 2017-03-22 DIAGNOSIS — R1013 Epigastric pain: Secondary | ICD-10-CM | POA: Diagnosis not present

## 2017-03-22 DIAGNOSIS — R109 Unspecified abdominal pain: Secondary | ICD-10-CM | POA: Diagnosis not present

## 2017-03-30 DIAGNOSIS — F900 Attention-deficit hyperactivity disorder, predominantly inattentive type: Secondary | ICD-10-CM | POA: Diagnosis not present

## 2017-03-30 DIAGNOSIS — F331 Major depressive disorder, recurrent, moderate: Secondary | ICD-10-CM | POA: Diagnosis not present

## 2017-03-30 DIAGNOSIS — Z79891 Long term (current) use of opiate analgesic: Secondary | ICD-10-CM | POA: Diagnosis not present

## 2017-03-30 DIAGNOSIS — F411 Generalized anxiety disorder: Secondary | ICD-10-CM | POA: Diagnosis not present

## 2017-03-30 DIAGNOSIS — G4700 Insomnia, unspecified: Secondary | ICD-10-CM | POA: Diagnosis not present

## 2017-03-31 DIAGNOSIS — R1010 Upper abdominal pain, unspecified: Secondary | ICD-10-CM | POA: Diagnosis not present

## 2017-03-31 DIAGNOSIS — R634 Abnormal weight loss: Secondary | ICD-10-CM | POA: Diagnosis not present

## 2017-03-31 DIAGNOSIS — K5900 Constipation, unspecified: Secondary | ICD-10-CM | POA: Diagnosis not present

## 2017-04-10 DIAGNOSIS — K208 Other esophagitis: Secondary | ICD-10-CM | POA: Diagnosis not present

## 2017-04-10 DIAGNOSIS — K6289 Other specified diseases of anus and rectum: Secondary | ICD-10-CM | POA: Diagnosis not present

## 2017-04-10 DIAGNOSIS — K6389 Other specified diseases of intestine: Secondary | ICD-10-CM | POA: Diagnosis not present

## 2017-04-10 DIAGNOSIS — R1011 Right upper quadrant pain: Secondary | ICD-10-CM | POA: Diagnosis not present

## 2017-04-10 DIAGNOSIS — K625 Hemorrhage of anus and rectum: Secondary | ICD-10-CM | POA: Diagnosis not present

## 2017-04-10 DIAGNOSIS — K633 Ulcer of intestine: Secondary | ICD-10-CM | POA: Diagnosis not present

## 2017-04-10 DIAGNOSIS — K259 Gastric ulcer, unspecified as acute or chronic, without hemorrhage or perforation: Secondary | ICD-10-CM | POA: Diagnosis not present

## 2017-04-13 DIAGNOSIS — Z79891 Long term (current) use of opiate analgesic: Secondary | ICD-10-CM | POA: Diagnosis not present

## 2017-04-26 DIAGNOSIS — Z79891 Long term (current) use of opiate analgesic: Secondary | ICD-10-CM | POA: Diagnosis not present

## 2017-05-01 DIAGNOSIS — G4700 Insomnia, unspecified: Secondary | ICD-10-CM | POA: Diagnosis not present

## 2017-05-01 DIAGNOSIS — F1123 Opioid dependence with withdrawal: Secondary | ICD-10-CM | POA: Diagnosis not present

## 2017-05-01 DIAGNOSIS — Z1389 Encounter for screening for other disorder: Secondary | ICD-10-CM | POA: Diagnosis not present

## 2017-05-01 DIAGNOSIS — Z6822 Body mass index (BMI) 22.0-22.9, adult: Secondary | ICD-10-CM | POA: Diagnosis not present

## 2017-05-10 DIAGNOSIS — Z79891 Long term (current) use of opiate analgesic: Secondary | ICD-10-CM | POA: Diagnosis not present

## 2017-05-12 DIAGNOSIS — M25561 Pain in right knee: Secondary | ICD-10-CM | POA: Diagnosis not present

## 2017-05-12 DIAGNOSIS — E782 Mixed hyperlipidemia: Secondary | ICD-10-CM | POA: Diagnosis not present

## 2017-05-12 DIAGNOSIS — F411 Generalized anxiety disorder: Secondary | ICD-10-CM | POA: Diagnosis not present

## 2017-05-12 DIAGNOSIS — K219 Gastro-esophageal reflux disease without esophagitis: Secondary | ICD-10-CM | POA: Diagnosis not present

## 2017-05-26 DIAGNOSIS — Z79891 Long term (current) use of opiate analgesic: Secondary | ICD-10-CM | POA: Diagnosis not present

## 2017-06-07 DIAGNOSIS — F411 Generalized anxiety disorder: Secondary | ICD-10-CM | POA: Diagnosis not present

## 2017-06-07 DIAGNOSIS — Z6822 Body mass index (BMI) 22.0-22.9, adult: Secondary | ICD-10-CM | POA: Diagnosis not present

## 2017-06-07 DIAGNOSIS — Z1389 Encounter for screening for other disorder: Secondary | ICD-10-CM | POA: Diagnosis not present

## 2017-06-07 DIAGNOSIS — G4700 Insomnia, unspecified: Secondary | ICD-10-CM | POA: Diagnosis not present

## 2017-06-27 DIAGNOSIS — Z79891 Long term (current) use of opiate analgesic: Secondary | ICD-10-CM | POA: Diagnosis not present

## 2017-07-25 DIAGNOSIS — Z79891 Long term (current) use of opiate analgesic: Secondary | ICD-10-CM | POA: Diagnosis not present

## 2017-08-09 DIAGNOSIS — B009 Herpesviral infection, unspecified: Secondary | ICD-10-CM | POA: Diagnosis not present

## 2017-08-09 DIAGNOSIS — Z1389 Encounter for screening for other disorder: Secondary | ICD-10-CM | POA: Diagnosis not present

## 2017-08-09 DIAGNOSIS — F411 Generalized anxiety disorder: Secondary | ICD-10-CM | POA: Diagnosis not present

## 2017-08-09 DIAGNOSIS — G5603 Carpal tunnel syndrome, bilateral upper limbs: Secondary | ICD-10-CM | POA: Diagnosis not present

## 2017-09-08 DIAGNOSIS — N3010 Interstitial cystitis (chronic) without hematuria: Secondary | ICD-10-CM | POA: Diagnosis not present

## 2017-09-08 DIAGNOSIS — N760 Acute vaginitis: Secondary | ICD-10-CM | POA: Diagnosis not present

## 2017-09-12 DIAGNOSIS — Z79891 Long term (current) use of opiate analgesic: Secondary | ICD-10-CM | POA: Diagnosis not present

## 2017-10-18 DIAGNOSIS — M79672 Pain in left foot: Secondary | ICD-10-CM | POA: Diagnosis not present

## 2017-10-18 DIAGNOSIS — M791 Myalgia, unspecified site: Secondary | ICD-10-CM | POA: Diagnosis not present

## 2017-10-18 DIAGNOSIS — Z13828 Encounter for screening for other musculoskeletal disorder: Secondary | ICD-10-CM | POA: Diagnosis not present

## 2017-10-18 DIAGNOSIS — M329 Systemic lupus erythematosus, unspecified: Secondary | ICD-10-CM | POA: Diagnosis not present

## 2017-10-18 DIAGNOSIS — M2550 Pain in unspecified joint: Secondary | ICD-10-CM | POA: Diagnosis not present

## 2017-10-18 DIAGNOSIS — M064 Inflammatory polyarthropathy: Secondary | ICD-10-CM | POA: Diagnosis not present

## 2017-10-18 DIAGNOSIS — M79671 Pain in right foot: Secondary | ICD-10-CM | POA: Diagnosis not present

## 2017-10-18 DIAGNOSIS — M25572 Pain in left ankle and joints of left foot: Secondary | ICD-10-CM | POA: Diagnosis not present

## 2017-10-18 DIAGNOSIS — M25571 Pain in right ankle and joints of right foot: Secondary | ICD-10-CM | POA: Diagnosis not present

## 2017-10-25 DIAGNOSIS — M791 Myalgia, unspecified site: Secondary | ICD-10-CM | POA: Diagnosis not present

## 2017-10-25 DIAGNOSIS — M064 Inflammatory polyarthropathy: Secondary | ICD-10-CM | POA: Diagnosis not present

## 2017-10-25 DIAGNOSIS — M2550 Pain in unspecified joint: Secondary | ICD-10-CM | POA: Diagnosis not present

## 2017-11-02 DIAGNOSIS — G4700 Insomnia, unspecified: Secondary | ICD-10-CM | POA: Diagnosis not present

## 2017-11-02 DIAGNOSIS — M791 Myalgia, unspecified site: Secondary | ICD-10-CM | POA: Diagnosis not present

## 2017-11-02 DIAGNOSIS — M064 Inflammatory polyarthropathy: Secondary | ICD-10-CM | POA: Diagnosis not present

## 2017-11-02 DIAGNOSIS — M2550 Pain in unspecified joint: Secondary | ICD-10-CM | POA: Diagnosis not present

## 2017-11-02 DIAGNOSIS — F411 Generalized anxiety disorder: Secondary | ICD-10-CM | POA: Diagnosis not present

## 2017-11-02 DIAGNOSIS — Z1389 Encounter for screening for other disorder: Secondary | ICD-10-CM | POA: Diagnosis not present

## 2017-11-02 DIAGNOSIS — G5600 Carpal tunnel syndrome, unspecified upper limb: Secondary | ICD-10-CM | POA: Diagnosis not present

## 2017-11-02 DIAGNOSIS — Z6822 Body mass index (BMI) 22.0-22.9, adult: Secondary | ICD-10-CM | POA: Diagnosis not present

## 2017-11-08 DIAGNOSIS — Z79891 Long term (current) use of opiate analgesic: Secondary | ICD-10-CM | POA: Diagnosis not present

## 2017-11-20 ENCOUNTER — Encounter: Payer: Self-pay | Admitting: Neurology

## 2017-11-20 ENCOUNTER — Telehealth: Payer: Self-pay | Admitting: Neurology

## 2017-11-20 ENCOUNTER — Encounter: Payer: Federal, State, Local not specified - PPO | Admitting: Neurology

## 2017-11-20 NOTE — Telephone Encounter (Signed)
This patient did not show for an EMG and nerve conduction study procedure today. 

## 2017-11-22 DIAGNOSIS — Z79891 Long term (current) use of opiate analgesic: Secondary | ICD-10-CM | POA: Diagnosis not present

## 2017-12-06 DIAGNOSIS — M9902 Segmental and somatic dysfunction of thoracic region: Secondary | ICD-10-CM | POA: Diagnosis not present

## 2017-12-06 DIAGNOSIS — M9903 Segmental and somatic dysfunction of lumbar region: Secondary | ICD-10-CM | POA: Diagnosis not present

## 2017-12-06 DIAGNOSIS — M9901 Segmental and somatic dysfunction of cervical region: Secondary | ICD-10-CM | POA: Diagnosis not present

## 2017-12-26 DIAGNOSIS — Z79891 Long term (current) use of opiate analgesic: Secondary | ICD-10-CM | POA: Diagnosis not present

## 2017-12-27 DIAGNOSIS — M9902 Segmental and somatic dysfunction of thoracic region: Secondary | ICD-10-CM | POA: Diagnosis not present

## 2017-12-27 DIAGNOSIS — M9901 Segmental and somatic dysfunction of cervical region: Secondary | ICD-10-CM | POA: Diagnosis not present

## 2017-12-27 DIAGNOSIS — M9903 Segmental and somatic dysfunction of lumbar region: Secondary | ICD-10-CM | POA: Diagnosis not present

## 2018-01-01 ENCOUNTER — Encounter: Payer: Federal, State, Local not specified - PPO | Admitting: Neurology

## 2018-01-24 ENCOUNTER — Encounter

## 2018-01-24 ENCOUNTER — Ambulatory Visit (INDEPENDENT_AMBULATORY_CARE_PROVIDER_SITE_OTHER): Payer: Federal, State, Local not specified - PPO | Admitting: Neurology

## 2018-01-24 ENCOUNTER — Encounter: Payer: Self-pay | Admitting: Neurology

## 2018-01-24 ENCOUNTER — Ambulatory Visit: Payer: Federal, State, Local not specified - PPO | Admitting: Neurology

## 2018-01-24 DIAGNOSIS — G5601 Carpal tunnel syndrome, right upper limb: Secondary | ICD-10-CM | POA: Diagnosis not present

## 2018-01-24 HISTORY — DX: Carpal tunnel syndrome, right upper limb: G56.01

## 2018-01-24 NOTE — Progress Notes (Signed)
Please refer to EMG and nerve conduction procedure note.  

## 2018-01-24 NOTE — Procedures (Signed)
     HISTORY:  Cynthia Brooks is a 37 year old patient with history of inflammatory arthritis and fibromyalgia who is being evaluated for pain and discomfort in the right greater than left arm and hand and for neck and shoulder discomfort.  The patient is being evaluated for a possible neuropathy or a cervical radiculopathy.  NERVE CONDUCTION STUDIES:  Nerve conduction studies were performed on both upper extremities.  The distal motor latencies for the median nerves were prolonged on the right, normal on the left, with a slightly low motor amplitude on the right, normal on the left.  The distal motor latencies motor amplitudes for the ulnar nerves were normal bilaterally.  The nerve conduction velocities for the median and ulnar nerves were normal bilaterally.  The sensory latencies for the median nerves were prolonged bilaterally, normal for the ulnar nerve bilaterally.  The F-wave latencies for the ulnar nerves were normal bilaterally.  EMG STUDIES:  EMG study was performed on the right upper extremity:  The first dorsal interosseous muscle reveals 2 to 4 K units with full recruitment. No fibrillations or positive waves were noted. The abductor pollicis brevis muscle reveals 2 to 4 K units with full recruitment. No fibrillations or positive waves were noted. The extensor indicis proprius muscle reveals 1 to 3 K units with full recruitment. No fibrillations or positive waves were noted. The pronator teres muscle reveals 2 to 3 K units with full recruitment. No fibrillations or positive waves were noted. The biceps muscle reveals 1 to 2 K units with full recruitment. No fibrillations or positive waves were noted. The triceps muscle reveals 2 to 4 K units with full recruitment. No fibrillations or positive waves were noted. The anterior deltoid muscle reveals 2 to 3 K units with full recruitment. No fibrillations or positive waves were noted. The cervical paraspinal muscles were tested at 2  levels. No abnormalities of insertional activity were seen at either level tested. There was good relaxation.   IMPRESSION:  Nerve conduction studies done on both upper extremities shows evidence of mild right carpal tunnel syndrome and evidence of a borderline left carpal tunnel syndrome.  EMG evaluation of the right upper extremity was unremarkable, no evidence of an overlying cervical radiculopathy was seen.  Marlan Palau MD 01/24/2018 4:23 PM  [facility] [address] [place], [place] [address]  Phone [phone] Fax [phone]

## 2018-01-24 NOTE — Progress Notes (Signed)
[facility]    Nerve / Sites Muscle Latency Ref. Amplitude Ref. Rel Amp Segments Distance Velocity Ref. Area    ms ms mV mV %  cm m/s m/s mVms  R Median - APB     Wrist APB 5.8 ?4.4 3.5 ?4.0 100 Wrist - APB 7   17.8     Upper arm APB 9.8  3.5  99.4 Upper arm - Wrist 21 52 ?49 18.6  L Median - APB     Wrist APB 4.0 ?4.4 4.0 ?4.0 100 Wrist - APB 7   16.1     Upper arm APB 8.2  3.5  87.4 Upper arm - Wrist 21 50 ?49 16.6  R Ulnar - ADM     Wrist ADM 3.1 ?3.3 10.5 ?6.0 100 Wrist - ADM 7   44.4     B.Elbow ADM 6.9  11.0  105 B.Elbow - Wrist 22 58 ?49 42.7     A.Elbow ADM 8.5  10.3  93.3 A.Elbow - B.Elbow 10 62 ?49 40.5         A.Elbow - Wrist      L Ulnar - ADM     Wrist ADM 3.1 ?3.3 11.6 ?6.0 100 Wrist - ADM 7   43.3     B.Elbow ADM 6.9  10.0  86.9 B.Elbow - Wrist 22 58 ?49 38.6     A.Elbow ADM 8.6  10.6  105 A.Elbow - B.Elbow 10 58 ?49 38.8         A.Elbow - Wrist                 [facility]    Nerve / Sites Rec. Site Peak Lat Ref.  Amp Ref. Segments Distance    ms ms V V  cm  R Median - Orthodromic (Dig II, Mid palm)     Dig II Wrist 4.5 ?3.4 7 ?10 Dig II - Wrist 13  L Median - Orthodromic (Dig II, Mid palm)     Dig II Wrist 4.0 ?3.4 15 ?10 Dig II - Wrist 13  R Ulnar - Orthodromic, (Dig V, Mid palm)     Dig V Wrist 3.0 ?3.1 16 ?5 Dig V - Wrist 11  L Ulnar - Orthodromic, (Dig V, Mid palm)     Dig V Wrist 3.1 ?3.1 15 ?5 Dig V - Wrist 14             F  Wave    Nerve F Lat Ref.   ms ms  R Ulnar - ADM 27.5 ?32.0  L Ulnar - ADM 27.2 ?32.0         EMG full       EMG Summary Table    Spontaneous MUAP Recruitment  Muscle IA Fib PSW Fasc Other Amp Dur. Poly Pattern  L. Abductor pollicis brevis Normal None None None _______ Normal Normal Normal Normal

## 2018-01-31 DIAGNOSIS — F411 Generalized anxiety disorder: Secondary | ICD-10-CM | POA: Diagnosis not present

## 2018-01-31 DIAGNOSIS — G4700 Insomnia, unspecified: Secondary | ICD-10-CM | POA: Diagnosis not present

## 2018-01-31 DIAGNOSIS — Z1389 Encounter for screening for other disorder: Secondary | ICD-10-CM | POA: Diagnosis not present

## 2018-01-31 DIAGNOSIS — Z6821 Body mass index (BMI) 21.0-21.9, adult: Secondary | ICD-10-CM | POA: Diagnosis not present

## 2018-02-09 DIAGNOSIS — M797 Fibromyalgia: Secondary | ICD-10-CM | POA: Diagnosis not present

## 2018-02-09 DIAGNOSIS — G5600 Carpal tunnel syndrome, unspecified upper limb: Secondary | ICD-10-CM | POA: Diagnosis not present

## 2018-02-09 DIAGNOSIS — M791 Myalgia, unspecified site: Secondary | ICD-10-CM | POA: Diagnosis not present

## 2018-02-09 DIAGNOSIS — M2550 Pain in unspecified joint: Secondary | ICD-10-CM | POA: Diagnosis not present

## 2018-04-30 DIAGNOSIS — Z79891 Long term (current) use of opiate analgesic: Secondary | ICD-10-CM | POA: Diagnosis not present

## 2018-05-02 DIAGNOSIS — Z1331 Encounter for screening for depression: Secondary | ICD-10-CM | POA: Diagnosis not present

## 2018-05-02 DIAGNOSIS — Z1389 Encounter for screening for other disorder: Secondary | ICD-10-CM | POA: Diagnosis not present

## 2018-05-02 DIAGNOSIS — G4700 Insomnia, unspecified: Secondary | ICD-10-CM | POA: Diagnosis not present

## 2018-05-02 DIAGNOSIS — M5135 Other intervertebral disc degeneration, thoracolumbar region: Secondary | ICD-10-CM | POA: Diagnosis not present

## 2018-05-02 DIAGNOSIS — F411 Generalized anxiety disorder: Secondary | ICD-10-CM | POA: Diagnosis not present

## 2018-05-10 DIAGNOSIS — M419 Scoliosis, unspecified: Secondary | ICD-10-CM | POA: Diagnosis not present

## 2018-05-14 DIAGNOSIS — Z79891 Long term (current) use of opiate analgesic: Secondary | ICD-10-CM | POA: Diagnosis not present

## 2018-05-24 DIAGNOSIS — Z79891 Long term (current) use of opiate analgesic: Secondary | ICD-10-CM | POA: Diagnosis not present

## 2018-05-28 DIAGNOSIS — M5126 Other intervertebral disc displacement, lumbar region: Secondary | ICD-10-CM | POA: Diagnosis not present

## 2018-05-28 DIAGNOSIS — M419 Scoliosis, unspecified: Secondary | ICD-10-CM | POA: Diagnosis not present

## 2018-05-28 DIAGNOSIS — M5124 Other intervertebral disc displacement, thoracic region: Secondary | ICD-10-CM | POA: Diagnosis not present

## 2018-05-28 DIAGNOSIS — R200 Anesthesia of skin: Secondary | ICD-10-CM | POA: Diagnosis not present

## 2018-05-28 DIAGNOSIS — M5134 Other intervertebral disc degeneration, thoracic region: Secondary | ICD-10-CM | POA: Diagnosis not present

## 2018-05-28 DIAGNOSIS — M47816 Spondylosis without myelopathy or radiculopathy, lumbar region: Secondary | ICD-10-CM | POA: Diagnosis not present

## 2018-05-28 DIAGNOSIS — M546 Pain in thoracic spine: Secondary | ICD-10-CM | POA: Diagnosis not present

## 2018-05-30 DIAGNOSIS — M5021 Other cervical disc displacement,  high cervical region: Secondary | ICD-10-CM | POA: Diagnosis not present

## 2018-05-30 DIAGNOSIS — M542 Cervicalgia: Secondary | ICD-10-CM | POA: Diagnosis not present

## 2018-05-30 DIAGNOSIS — M4802 Spinal stenosis, cervical region: Secondary | ICD-10-CM | POA: Diagnosis not present

## 2018-06-04 DIAGNOSIS — M7061 Trochanteric bursitis, right hip: Secondary | ICD-10-CM | POA: Diagnosis not present

## 2018-06-04 DIAGNOSIS — M7062 Trochanteric bursitis, left hip: Secondary | ICD-10-CM | POA: Diagnosis not present

## 2018-06-15 DIAGNOSIS — M5135 Other intervertebral disc degeneration, thoracolumbar region: Secondary | ICD-10-CM | POA: Diagnosis not present

## 2018-06-20 DIAGNOSIS — G5603 Carpal tunnel syndrome, bilateral upper limbs: Secondary | ICD-10-CM | POA: Diagnosis not present

## 2018-06-22 DIAGNOSIS — M419 Scoliosis, unspecified: Secondary | ICD-10-CM | POA: Diagnosis not present

## 2018-06-22 DIAGNOSIS — M791 Myalgia, unspecified site: Secondary | ICD-10-CM | POA: Diagnosis not present

## 2018-06-22 DIAGNOSIS — R21 Rash and other nonspecific skin eruption: Secondary | ICD-10-CM | POA: Diagnosis not present

## 2018-06-22 DIAGNOSIS — M545 Low back pain: Secondary | ICD-10-CM | POA: Diagnosis not present

## 2018-06-26 DIAGNOSIS — Z79891 Long term (current) use of opiate analgesic: Secondary | ICD-10-CM | POA: Diagnosis not present

## 2018-07-24 DIAGNOSIS — Z1339 Encounter for screening examination for other mental health and behavioral disorders: Secondary | ICD-10-CM | POA: Diagnosis not present

## 2018-07-24 DIAGNOSIS — F411 Generalized anxiety disorder: Secondary | ICD-10-CM | POA: Diagnosis not present

## 2018-07-24 DIAGNOSIS — Z6821 Body mass index (BMI) 21.0-21.9, adult: Secondary | ICD-10-CM | POA: Diagnosis not present

## 2018-07-24 DIAGNOSIS — G4700 Insomnia, unspecified: Secondary | ICD-10-CM | POA: Diagnosis not present

## 2018-08-06 DIAGNOSIS — N760 Acute vaginitis: Secondary | ICD-10-CM | POA: Diagnosis not present

## 2018-08-06 DIAGNOSIS — Z113 Encounter for screening for infections with a predominantly sexual mode of transmission: Secondary | ICD-10-CM | POA: Diagnosis not present

## 2018-08-06 DIAGNOSIS — R109 Unspecified abdominal pain: Secondary | ICD-10-CM | POA: Diagnosis not present

## 2018-08-06 DIAGNOSIS — N926 Irregular menstruation, unspecified: Secondary | ICD-10-CM | POA: Diagnosis not present

## 2018-08-06 DIAGNOSIS — N390 Urinary tract infection, site not specified: Secondary | ICD-10-CM | POA: Diagnosis not present

## 2018-09-05 DIAGNOSIS — Z113 Encounter for screening for infections with a predominantly sexual mode of transmission: Secondary | ICD-10-CM | POA: Diagnosis not present

## 2018-09-05 DIAGNOSIS — N760 Acute vaginitis: Secondary | ICD-10-CM | POA: Diagnosis not present

## 2018-09-05 DIAGNOSIS — R399 Unspecified symptoms and signs involving the genitourinary system: Secondary | ICD-10-CM | POA: Diagnosis not present

## 2018-09-18 DIAGNOSIS — Z79891 Long term (current) use of opiate analgesic: Secondary | ICD-10-CM | POA: Diagnosis not present

## 2018-10-23 DIAGNOSIS — Z79891 Long term (current) use of opiate analgesic: Secondary | ICD-10-CM | POA: Diagnosis not present

## 2018-10-24 DIAGNOSIS — F411 Generalized anxiety disorder: Secondary | ICD-10-CM | POA: Diagnosis not present

## 2018-10-24 DIAGNOSIS — Z1339 Encounter for screening examination for other mental health and behavioral disorders: Secondary | ICD-10-CM | POA: Diagnosis not present

## 2018-10-24 DIAGNOSIS — Z6820 Body mass index (BMI) 20.0-20.9, adult: Secondary | ICD-10-CM | POA: Diagnosis not present

## 2018-10-24 DIAGNOSIS — G4700 Insomnia, unspecified: Secondary | ICD-10-CM | POA: Diagnosis not present

## 2018-12-04 DIAGNOSIS — Z79891 Long term (current) use of opiate analgesic: Secondary | ICD-10-CM | POA: Diagnosis not present

## 2018-12-26 DIAGNOSIS — G4700 Insomnia, unspecified: Secondary | ICD-10-CM | POA: Diagnosis not present

## 2018-12-26 DIAGNOSIS — Z6820 Body mass index (BMI) 20.0-20.9, adult: Secondary | ICD-10-CM | POA: Diagnosis not present

## 2018-12-26 DIAGNOSIS — Z1339 Encounter for screening examination for other mental health and behavioral disorders: Secondary | ICD-10-CM | POA: Diagnosis not present

## 2018-12-26 DIAGNOSIS — F411 Generalized anxiety disorder: Secondary | ICD-10-CM | POA: Diagnosis not present

## 2019-01-07 DIAGNOSIS — Z79891 Long term (current) use of opiate analgesic: Secondary | ICD-10-CM | POA: Diagnosis not present

## 2019-02-01 DIAGNOSIS — M546 Pain in thoracic spine: Secondary | ICD-10-CM | POA: Diagnosis not present

## 2019-02-01 DIAGNOSIS — M542 Cervicalgia: Secondary | ICD-10-CM | POA: Diagnosis not present

## 2019-02-01 DIAGNOSIS — M47892 Other spondylosis, cervical region: Secondary | ICD-10-CM | POA: Diagnosis not present

## 2019-02-01 DIAGNOSIS — M4120 Other idiopathic scoliosis, site unspecified: Secondary | ICD-10-CM | POA: Diagnosis not present

## 2019-02-01 DIAGNOSIS — Z6823 Body mass index (BMI) 23.0-23.9, adult: Secondary | ICD-10-CM | POA: Diagnosis not present

## 2019-02-14 DIAGNOSIS — M5412 Radiculopathy, cervical region: Secondary | ICD-10-CM | POA: Diagnosis not present

## 2019-02-14 DIAGNOSIS — M4120 Other idiopathic scoliosis, site unspecified: Secondary | ICD-10-CM | POA: Diagnosis not present

## 2019-02-14 DIAGNOSIS — M419 Scoliosis, unspecified: Secondary | ICD-10-CM | POA: Diagnosis not present

## 2019-02-14 DIAGNOSIS — M47892 Other spondylosis, cervical region: Secondary | ICD-10-CM | POA: Diagnosis not present

## 2019-02-14 DIAGNOSIS — Z6823 Body mass index (BMI) 23.0-23.9, adult: Secondary | ICD-10-CM | POA: Diagnosis not present

## 2019-03-25 DIAGNOSIS — Z1339 Encounter for screening examination for other mental health and behavioral disorders: Secondary | ICD-10-CM | POA: Diagnosis not present

## 2019-03-25 DIAGNOSIS — F411 Generalized anxiety disorder: Secondary | ICD-10-CM | POA: Diagnosis not present

## 2019-03-25 DIAGNOSIS — Z6820 Body mass index (BMI) 20.0-20.9, adult: Secondary | ICD-10-CM | POA: Diagnosis not present

## 2019-03-25 DIAGNOSIS — G4700 Insomnia, unspecified: Secondary | ICD-10-CM | POA: Diagnosis not present

## 2019-04-02 IMAGING — CT CT ABD-PELV W/ CM
1 of 3 series · 13 of 32 positions shown, 19 images · IV contrast (iopamidol)
Comparison: None.

CLINICAL DATA: Patient with right lower quadrant abdominal pain and
nausea. Recent laparoscopic tubal ligation. Prior appendectomy and
cholecystectomy.

EXAM:
CT ABDOMEN AND PELVIS WITH CONTRAST
TECHNIQUE: Multidetector CT imaging of the abdomen and pelvis was performed
using the standard protocol following bolus administration of
intravenous contrast.
CONTRAST:  100mL PP3DW3-800 IOPAMIDOL (PP3DW3-800) INJECTION 61%

[Series 2: routine abdomen/pelvis with · axial · 0.70mm/px · z∈[+613,+958]mm · 13 of 81 slices shown, 19 images]
[im 6/81  soft-tissue]
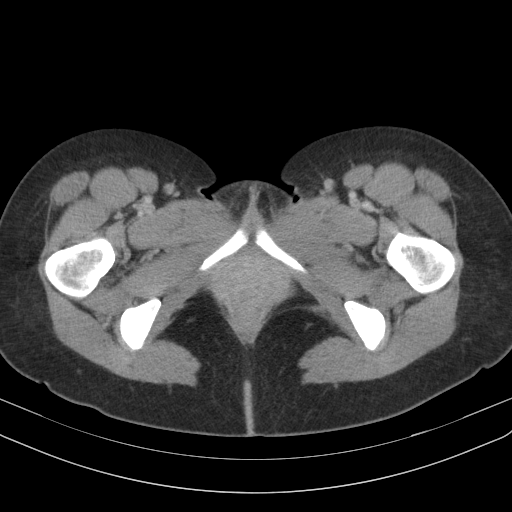
[im 6/81  bone]
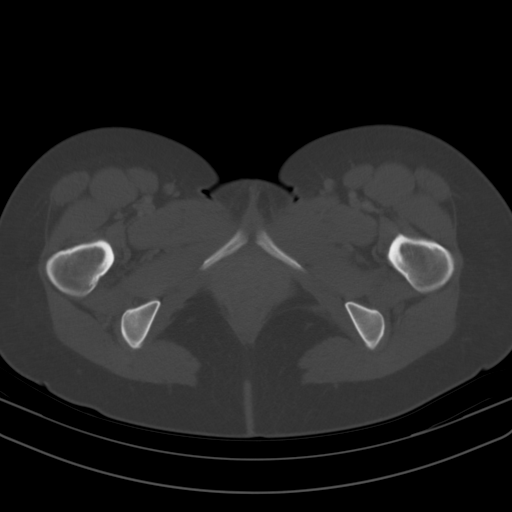
[im 11/81  soft-tissue]
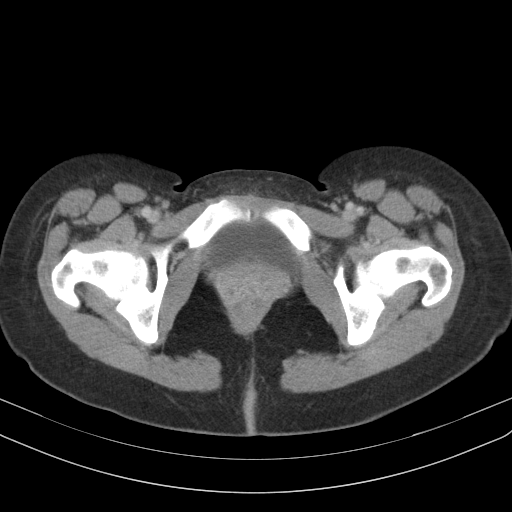
[im 17/81  soft-tissue]
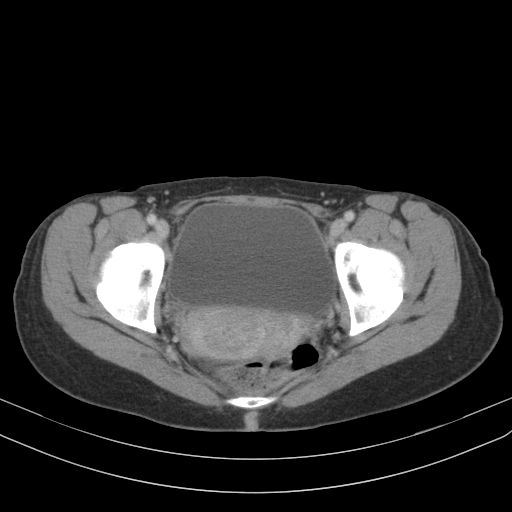
[im 22/81  soft-tissue]
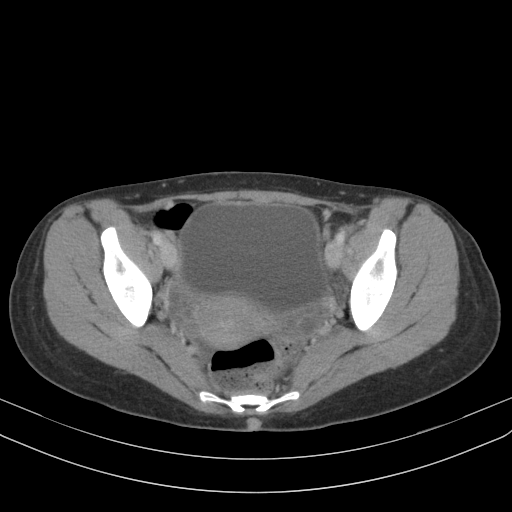
[im 27/81  soft-tissue]
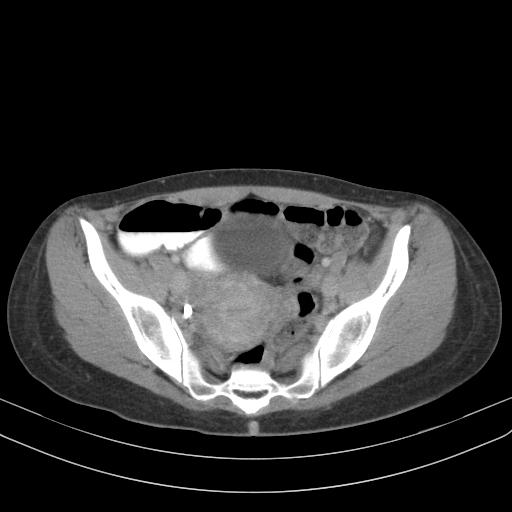
[im 33/81  soft-tissue]
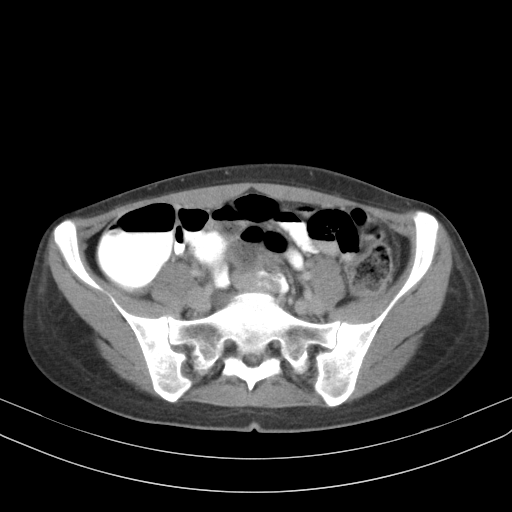
[im 43/81  soft-tissue]
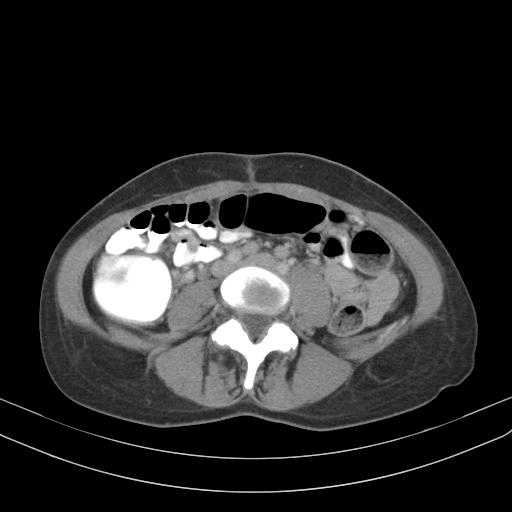
[im 49/81  soft-tissue]
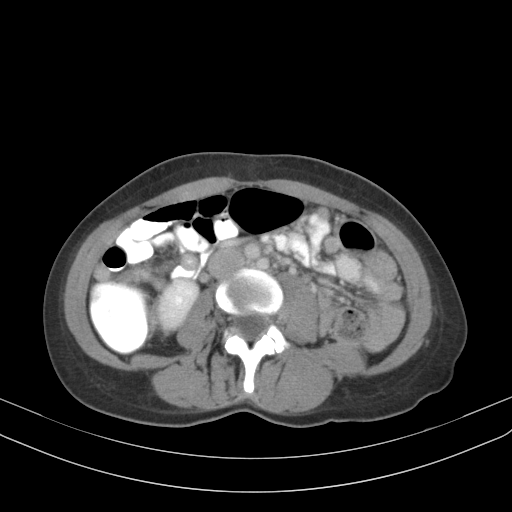
[im 54/81  soft-tissue]
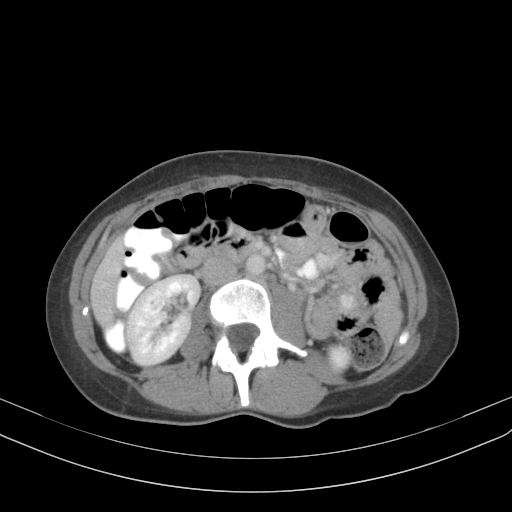
[im 54/81  bone]
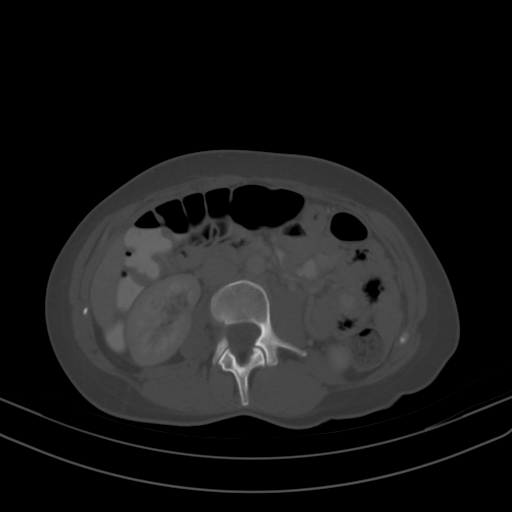
[im 59/81  soft-tissue]
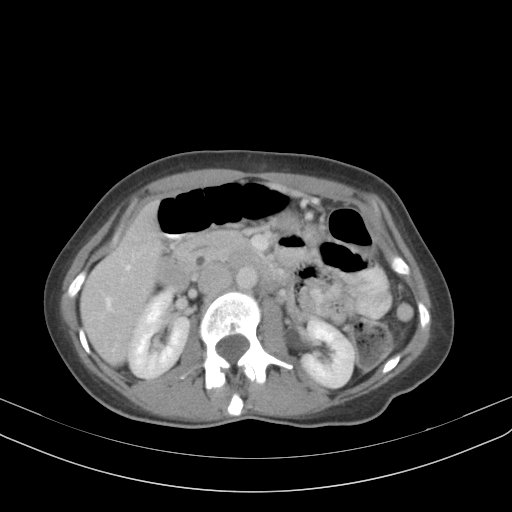
[im 59/81  lung]
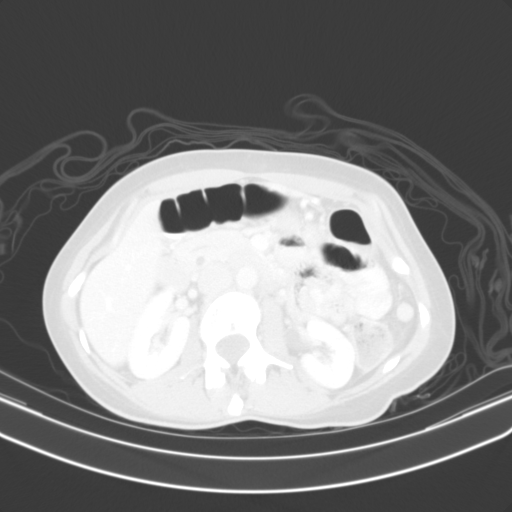
[im 65/81  soft-tissue]
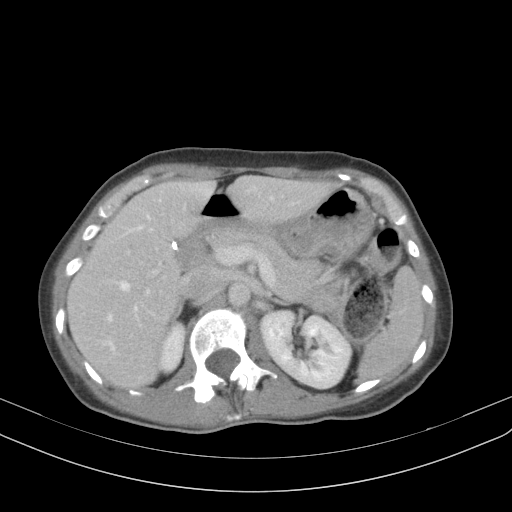
[im 65/81  lung]
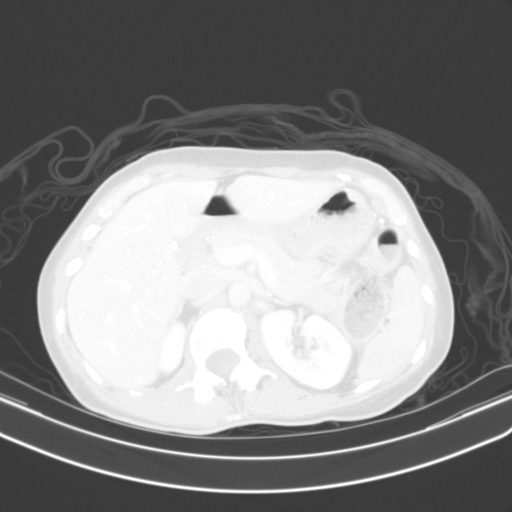
[im 70/81  soft-tissue]
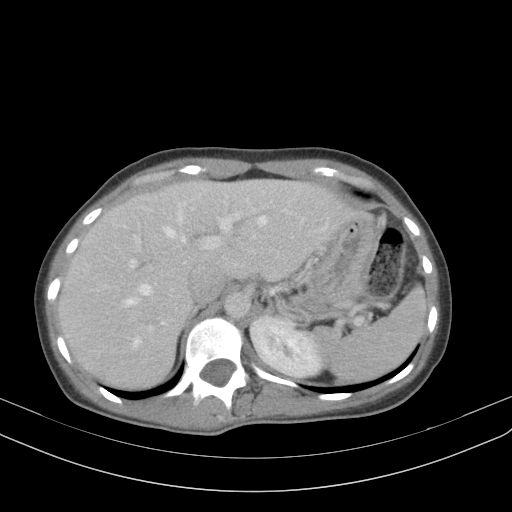
[im 70/81  lung]
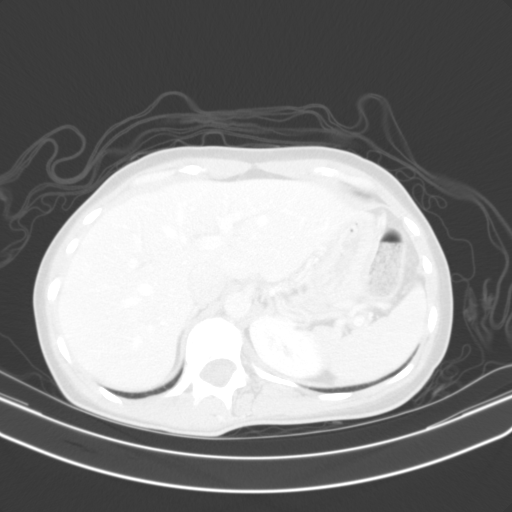
[im 75/81  soft-tissue]
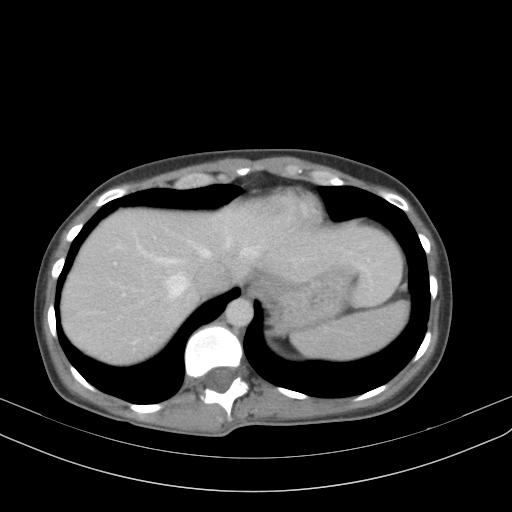
[im 75/81  lung]
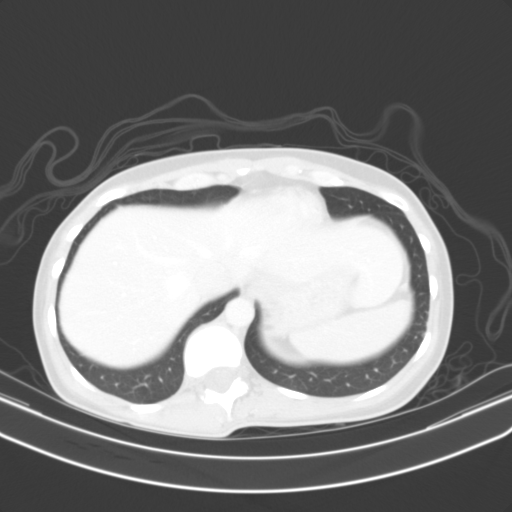

[13 of 32 positions shown; findings below may reference images not displayed]

FINDINGS: Lower chest: Normal heart size. Lung bases are clear. No pleural
effusion.

Hepatobiliary: Liver is normal in size and contour. No focal lesion
is identified. Gallbladder is unremarkable. No intrahepatic or
extrahepatic biliary duct dilatation.

Pancreas: Note is made of pancreatic divisum.

Spleen: Unremarkable

Adrenals/Urinary Tract: Adrenal glands are normal. Kidneys enhance
symmetrically with contrast. No hydronephrosis. Urinary bladder is
unremarkable.

Stomach/Bowel: No abnormal bowel wall thickening or evidence for
bowel obstruction. Trace free fluid in the pelvis. No free
intraperitoneal air.

Vascular/Lymphatic: Normal caliber abdominal aorta. No
retroperitoneal lymphadenopathy.

Reproductive: Uterus and adnexal structures are unremarkable. Tubal
ligation clips identified within the pelvis.

Other: None.

Musculoskeletal: Lumbar spine degenerative changes. No aggressive or
acute appearing osseous lesions.
IMPRESSION: Postsurgical changes compatible with recent tubal ligation. No acute
process within the abdomen or pelvis identified.

Pancreatic divisum.

## 2019-05-16 DIAGNOSIS — Z79891 Long term (current) use of opiate analgesic: Secondary | ICD-10-CM | POA: Diagnosis not present

## 2019-06-26 DIAGNOSIS — I999 Unspecified disorder of circulatory system: Secondary | ICD-10-CM | POA: Diagnosis not present

## 2019-06-26 DIAGNOSIS — G8929 Other chronic pain: Secondary | ICD-10-CM | POA: Diagnosis not present

## 2019-06-26 DIAGNOSIS — M47816 Spondylosis without myelopathy or radiculopathy, lumbar region: Secondary | ICD-10-CM | POA: Diagnosis not present

## 2019-06-27 DIAGNOSIS — Z79891 Long term (current) use of opiate analgesic: Secondary | ICD-10-CM | POA: Diagnosis not present

## 2019-07-16 DIAGNOSIS — R1031 Right lower quadrant pain: Secondary | ICD-10-CM | POA: Diagnosis not present

## 2019-07-16 DIAGNOSIS — F1120 Opioid dependence, uncomplicated: Secondary | ICD-10-CM | POA: Diagnosis not present

## 2019-07-16 DIAGNOSIS — K529 Noninfective gastroenteritis and colitis, unspecified: Secondary | ICD-10-CM | POA: Diagnosis not present

## 2019-07-16 DIAGNOSIS — R198 Other specified symptoms and signs involving the digestive system and abdomen: Secondary | ICD-10-CM | POA: Diagnosis not present

## 2019-07-22 DIAGNOSIS — H1013 Acute atopic conjunctivitis, bilateral: Secondary | ICD-10-CM | POA: Diagnosis not present

## 2019-07-23 DIAGNOSIS — M4180 Other forms of scoliosis, site unspecified: Secondary | ICD-10-CM | POA: Diagnosis not present

## 2019-07-23 DIAGNOSIS — R208 Other disturbances of skin sensation: Secondary | ICD-10-CM | POA: Diagnosis not present

## 2019-07-23 DIAGNOSIS — M79642 Pain in left hand: Secondary | ICD-10-CM | POA: Diagnosis not present

## 2019-07-23 DIAGNOSIS — M797 Fibromyalgia: Secondary | ICD-10-CM | POA: Diagnosis not present

## 2019-07-30 DIAGNOSIS — L301 Dyshidrosis [pompholyx]: Secondary | ICD-10-CM | POA: Diagnosis not present

## 2019-07-30 DIAGNOSIS — R21 Rash and other nonspecific skin eruption: Secondary | ICD-10-CM | POA: Diagnosis not present

## 2019-07-30 DIAGNOSIS — L503 Dermatographic urticaria: Secondary | ICD-10-CM | POA: Diagnosis not present

## 2019-08-02 DIAGNOSIS — F411 Generalized anxiety disorder: Secondary | ICD-10-CM | POA: Diagnosis not present

## 2019-08-02 DIAGNOSIS — Z1339 Encounter for screening examination for other mental health and behavioral disorders: Secondary | ICD-10-CM | POA: Diagnosis not present

## 2019-08-02 DIAGNOSIS — M47817 Spondylosis without myelopathy or radiculopathy, lumbosacral region: Secondary | ICD-10-CM | POA: Diagnosis not present

## 2019-08-02 DIAGNOSIS — G4700 Insomnia, unspecified: Secondary | ICD-10-CM | POA: Diagnosis not present

## 2019-08-06 DIAGNOSIS — R21 Rash and other nonspecific skin eruption: Secondary | ICD-10-CM | POA: Diagnosis not present

## 2019-08-06 DIAGNOSIS — M419 Scoliosis, unspecified: Secondary | ICD-10-CM | POA: Diagnosis not present

## 2019-08-06 DIAGNOSIS — M4680 Other specified inflammatory spondylopathies, site unspecified: Secondary | ICD-10-CM | POA: Diagnosis not present

## 2019-08-06 DIAGNOSIS — M47819 Spondylosis without myelopathy or radiculopathy, site unspecified: Secondary | ICD-10-CM | POA: Diagnosis not present

## 2019-08-08 DIAGNOSIS — Z79891 Long term (current) use of opiate analgesic: Secondary | ICD-10-CM | POA: Diagnosis not present

## 2019-08-09 DIAGNOSIS — R231 Pallor: Secondary | ICD-10-CM | POA: Diagnosis not present

## 2019-08-20 DIAGNOSIS — R21 Rash and other nonspecific skin eruption: Secondary | ICD-10-CM | POA: Diagnosis not present

## 2019-08-20 DIAGNOSIS — M419 Scoliosis, unspecified: Secondary | ICD-10-CM | POA: Diagnosis not present

## 2019-08-20 DIAGNOSIS — M47819 Spondylosis without myelopathy or radiculopathy, site unspecified: Secondary | ICD-10-CM | POA: Diagnosis not present

## 2019-08-20 DIAGNOSIS — M4680 Other specified inflammatory spondylopathies, site unspecified: Secondary | ICD-10-CM | POA: Diagnosis not present

## 2019-08-30 DIAGNOSIS — M47816 Spondylosis without myelopathy or radiculopathy, lumbar region: Secondary | ICD-10-CM | POA: Diagnosis not present

## 2019-08-30 DIAGNOSIS — R231 Pallor: Secondary | ICD-10-CM | POA: Diagnosis not present

## 2019-08-30 DIAGNOSIS — M79642 Pain in left hand: Secondary | ICD-10-CM | POA: Diagnosis not present

## 2019-10-03 DIAGNOSIS — Z79891 Long term (current) use of opiate analgesic: Secondary | ICD-10-CM | POA: Diagnosis not present

## 2019-10-22 DIAGNOSIS — R1031 Right lower quadrant pain: Secondary | ICD-10-CM | POA: Diagnosis not present

## 2019-10-22 DIAGNOSIS — K648 Other hemorrhoids: Secondary | ICD-10-CM | POA: Diagnosis not present

## 2019-10-23 DIAGNOSIS — G5603 Carpal tunnel syndrome, bilateral upper limbs: Secondary | ICD-10-CM | POA: Diagnosis not present

## 2019-10-23 DIAGNOSIS — Q796 Ehlers-Danlos syndrome, unspecified: Secondary | ICD-10-CM | POA: Diagnosis not present

## 2019-10-23 DIAGNOSIS — G8929 Other chronic pain: Secondary | ICD-10-CM | POA: Diagnosis not present

## 2019-10-28 DIAGNOSIS — R102 Pelvic and perineal pain: Secondary | ICD-10-CM | POA: Diagnosis not present

## 2019-10-28 DIAGNOSIS — N941 Unspecified dyspareunia: Secondary | ICD-10-CM | POA: Diagnosis not present

## 2019-10-29 DIAGNOSIS — Z1339 Encounter for screening examination for other mental health and behavioral disorders: Secondary | ICD-10-CM | POA: Diagnosis not present

## 2019-10-29 DIAGNOSIS — F411 Generalized anxiety disorder: Secondary | ICD-10-CM | POA: Diagnosis not present

## 2019-10-29 DIAGNOSIS — Z6820 Body mass index (BMI) 20.0-20.9, adult: Secondary | ICD-10-CM | POA: Diagnosis not present

## 2019-11-08 DIAGNOSIS — R102 Pelvic and perineal pain: Secondary | ICD-10-CM | POA: Diagnosis not present

## 2019-11-08 DIAGNOSIS — N83202 Unspecified ovarian cyst, left side: Secondary | ICD-10-CM | POA: Diagnosis not present

## 2019-11-08 DIAGNOSIS — N941 Unspecified dyspareunia: Secondary | ICD-10-CM | POA: Diagnosis not present

## 2019-12-19 DIAGNOSIS — M5031 Other cervical disc degeneration,  high cervical region: Secondary | ICD-10-CM | POA: Diagnosis not present

## 2019-12-19 DIAGNOSIS — M5412 Radiculopathy, cervical region: Secondary | ICD-10-CM | POA: Diagnosis not present

## 2019-12-19 DIAGNOSIS — M531 Cervicobrachial syndrome: Secondary | ICD-10-CM | POA: Diagnosis not present

## 2019-12-19 DIAGNOSIS — M9901 Segmental and somatic dysfunction of cervical region: Secondary | ICD-10-CM | POA: Diagnosis not present

## 2019-12-30 DIAGNOSIS — M2460 Ankylosis, unspecified joint: Secondary | ICD-10-CM | POA: Diagnosis not present

## 2019-12-30 DIAGNOSIS — F1120 Opioid dependence, uncomplicated: Secondary | ICD-10-CM | POA: Diagnosis not present

## 2019-12-30 DIAGNOSIS — Z1339 Encounter for screening examination for other mental health and behavioral disorders: Secondary | ICD-10-CM | POA: Diagnosis not present

## 2019-12-30 DIAGNOSIS — G4700 Insomnia, unspecified: Secondary | ICD-10-CM | POA: Diagnosis not present

## 2020-01-06 DIAGNOSIS — F1123 Opioid dependence with withdrawal: Secondary | ICD-10-CM | POA: Diagnosis not present

## 2020-01-06 DIAGNOSIS — Z79891 Long term (current) use of opiate analgesic: Secondary | ICD-10-CM | POA: Diagnosis not present

## 2020-01-06 DIAGNOSIS — F1121 Opioid dependence, in remission: Secondary | ICD-10-CM | POA: Diagnosis not present

## 2020-01-06 DIAGNOSIS — F11988 Opioid use, unspecified with other opioid-induced disorder: Secondary | ICD-10-CM | POA: Diagnosis not present

## 2020-01-14 DIAGNOSIS — F331 Major depressive disorder, recurrent, moderate: Secondary | ICD-10-CM | POA: Diagnosis not present

## 2020-01-14 DIAGNOSIS — Z681 Body mass index (BMI) 19 or less, adult: Secondary | ICD-10-CM | POA: Diagnosis not present

## 2020-01-14 DIAGNOSIS — F411 Generalized anxiety disorder: Secondary | ICD-10-CM | POA: Diagnosis not present

## 2020-01-14 DIAGNOSIS — F1121 Opioid dependence, in remission: Secondary | ICD-10-CM | POA: Diagnosis not present

## 2020-01-20 DIAGNOSIS — L292 Pruritus vulvae: Secondary | ICD-10-CM | POA: Diagnosis not present

## 2020-01-20 DIAGNOSIS — Z1231 Encounter for screening mammogram for malignant neoplasm of breast: Secondary | ICD-10-CM | POA: Diagnosis not present

## 2020-01-20 DIAGNOSIS — Z9189 Other specified personal risk factors, not elsewhere classified: Secondary | ICD-10-CM | POA: Diagnosis not present

## 2020-01-20 DIAGNOSIS — Z01419 Encounter for gynecological examination (general) (routine) without abnormal findings: Secondary | ICD-10-CM | POA: Diagnosis not present

## 2020-01-20 DIAGNOSIS — N760 Acute vaginitis: Secondary | ICD-10-CM | POA: Diagnosis not present

## 2020-01-20 DIAGNOSIS — Z6820 Body mass index (BMI) 20.0-20.9, adult: Secondary | ICD-10-CM | POA: Diagnosis not present

## 2020-01-20 DIAGNOSIS — N898 Other specified noninflammatory disorders of vagina: Secondary | ICD-10-CM | POA: Diagnosis not present

## 2020-01-20 DIAGNOSIS — L659 Nonscarring hair loss, unspecified: Secondary | ICD-10-CM | POA: Diagnosis not present

## 2020-01-20 DIAGNOSIS — Z113 Encounter for screening for infections with a predominantly sexual mode of transmission: Secondary | ICD-10-CM | POA: Diagnosis not present

## 2020-01-29 ENCOUNTER — Other Ambulatory Visit: Payer: Self-pay | Admitting: Obstetrics and Gynecology

## 2020-01-29 DIAGNOSIS — Z9189 Other specified personal risk factors, not elsewhere classified: Secondary | ICD-10-CM

## 2020-01-30 DIAGNOSIS — F902 Attention-deficit hyperactivity disorder, combined type: Secondary | ICD-10-CM | POA: Diagnosis not present

## 2020-01-30 DIAGNOSIS — Z6820 Body mass index (BMI) 20.0-20.9, adult: Secondary | ICD-10-CM | POA: Diagnosis not present

## 2020-01-30 DIAGNOSIS — F411 Generalized anxiety disorder: Secondary | ICD-10-CM | POA: Diagnosis not present

## 2020-02-11 DIAGNOSIS — F331 Major depressive disorder, recurrent, moderate: Secondary | ICD-10-CM | POA: Diagnosis not present

## 2020-02-11 DIAGNOSIS — F411 Generalized anxiety disorder: Secondary | ICD-10-CM | POA: Diagnosis not present

## 2020-02-11 DIAGNOSIS — F1121 Opioid dependence, in remission: Secondary | ICD-10-CM | POA: Diagnosis not present

## 2020-02-11 DIAGNOSIS — Z79891 Long term (current) use of opiate analgesic: Secondary | ICD-10-CM | POA: Diagnosis not present

## 2020-02-19 DIAGNOSIS — G8929 Other chronic pain: Secondary | ICD-10-CM | POA: Diagnosis not present

## 2020-02-19 DIAGNOSIS — G5603 Carpal tunnel syndrome, bilateral upper limbs: Secondary | ICD-10-CM | POA: Diagnosis not present

## 2020-02-19 DIAGNOSIS — Q796 Ehlers-Danlos syndrome, unspecified: Secondary | ICD-10-CM | POA: Diagnosis not present

## 2020-03-18 DIAGNOSIS — F331 Major depressive disorder, recurrent, moderate: Secondary | ICD-10-CM | POA: Diagnosis not present

## 2020-03-18 DIAGNOSIS — Z79891 Long term (current) use of opiate analgesic: Secondary | ICD-10-CM | POA: Diagnosis not present

## 2020-03-18 DIAGNOSIS — F1121 Opioid dependence, in remission: Secondary | ICD-10-CM | POA: Diagnosis not present

## 2020-03-18 DIAGNOSIS — F411 Generalized anxiety disorder: Secondary | ICD-10-CM | POA: Diagnosis not present

## 2020-04-15 ENCOUNTER — Other Ambulatory Visit: Payer: Self-pay

## 2020-04-15 DIAGNOSIS — Z5321 Procedure and treatment not carried out due to patient leaving prior to being seen by health care provider: Secondary | ICD-10-CM | POA: Insufficient documentation

## 2020-04-15 DIAGNOSIS — G8929 Other chronic pain: Secondary | ICD-10-CM | POA: Insufficient documentation

## 2020-04-15 DIAGNOSIS — R109 Unspecified abdominal pain: Secondary | ICD-10-CM | POA: Diagnosis not present

## 2020-04-15 DIAGNOSIS — F331 Major depressive disorder, recurrent, moderate: Secondary | ICD-10-CM | POA: Diagnosis not present

## 2020-04-15 DIAGNOSIS — F1121 Opioid dependence, in remission: Secondary | ICD-10-CM | POA: Diagnosis not present

## 2020-04-15 DIAGNOSIS — F411 Generalized anxiety disorder: Secondary | ICD-10-CM | POA: Diagnosis not present

## 2020-04-15 DIAGNOSIS — Z79891 Long term (current) use of opiate analgesic: Secondary | ICD-10-CM | POA: Diagnosis not present

## 2020-04-16 ENCOUNTER — Emergency Department (HOSPITAL_BASED_OUTPATIENT_CLINIC_OR_DEPARTMENT_OTHER)
Admission: EM | Admit: 2020-04-16 | Discharge: 2020-04-16 | Disposition: A | Discharge status: Home / Self Care | Payer: Federal, State, Local not specified - PPO | Attending: Emergency Medicine | Admitting: Emergency Medicine

## 2020-04-16 ENCOUNTER — Encounter (HOSPITAL_BASED_OUTPATIENT_CLINIC_OR_DEPARTMENT_OTHER): Payer: Self-pay | Admitting: *Deleted

## 2020-04-16 ENCOUNTER — Other Ambulatory Visit: Payer: Self-pay

## 2020-04-16 NOTE — ED Notes (Signed)
Called to treatment room  No response from lobby

## 2020-04-16 NOTE — ED Notes (Signed)
Called pt again  No response from lobby 

## 2020-04-16 NOTE — ED Triage Notes (Signed)
C/o right abd pain  X 1 day , hx chronic pain  Pt reports on suboxon and in pain managment

## 2020-05-01 DIAGNOSIS — R1031 Right lower quadrant pain: Secondary | ICD-10-CM | POA: Diagnosis not present

## 2020-05-05 ENCOUNTER — Other Ambulatory Visit: Payer: Self-pay | Admitting: Obstetrics and Gynecology

## 2020-05-05 DIAGNOSIS — R1031 Right lower quadrant pain: Secondary | ICD-10-CM

## 2020-05-13 DIAGNOSIS — F1121 Opioid dependence, in remission: Secondary | ICD-10-CM | POA: Diagnosis not present

## 2020-05-13 DIAGNOSIS — Z79891 Long term (current) use of opiate analgesic: Secondary | ICD-10-CM | POA: Diagnosis not present

## 2020-05-13 DIAGNOSIS — F331 Major depressive disorder, recurrent, moderate: Secondary | ICD-10-CM | POA: Diagnosis not present

## 2020-05-13 DIAGNOSIS — F411 Generalized anxiety disorder: Secondary | ICD-10-CM | POA: Diagnosis not present

## 2020-05-20 ENCOUNTER — Other Ambulatory Visit: Payer: Federal, State, Local not specified - PPO

## 2020-06-01 ENCOUNTER — Inpatient Hospital Stay: Admission: RE | Admit: 2020-06-01 | Payer: Federal, State, Local not specified - PPO | Source: Ambulatory Visit

## 2020-06-10 DIAGNOSIS — Z79891 Long term (current) use of opiate analgesic: Secondary | ICD-10-CM | POA: Diagnosis not present

## 2020-06-10 DIAGNOSIS — F331 Major depressive disorder, recurrent, moderate: Secondary | ICD-10-CM | POA: Diagnosis not present

## 2020-06-10 DIAGNOSIS — F1121 Opioid dependence, in remission: Secondary | ICD-10-CM | POA: Diagnosis not present

## 2020-06-10 DIAGNOSIS — F411 Generalized anxiety disorder: Secondary | ICD-10-CM | POA: Diagnosis not present

## 2020-06-12 DIAGNOSIS — F411 Generalized anxiety disorder: Secondary | ICD-10-CM | POA: Diagnosis not present

## 2020-06-12 DIAGNOSIS — F902 Attention-deficit hyperactivity disorder, combined type: Secondary | ICD-10-CM | POA: Diagnosis not present

## 2020-07-16 DIAGNOSIS — Z79891 Long term (current) use of opiate analgesic: Secondary | ICD-10-CM | POA: Diagnosis not present

## 2020-07-16 DIAGNOSIS — F411 Generalized anxiety disorder: Secondary | ICD-10-CM | POA: Diagnosis not present

## 2020-07-16 DIAGNOSIS — F1121 Opioid dependence, in remission: Secondary | ICD-10-CM | POA: Diagnosis not present

## 2020-07-16 DIAGNOSIS — F331 Major depressive disorder, recurrent, moderate: Secondary | ICD-10-CM | POA: Diagnosis not present

## 2020-08-20 DIAGNOSIS — F1121 Opioid dependence, in remission: Secondary | ICD-10-CM | POA: Diagnosis not present

## 2020-08-20 DIAGNOSIS — F411 Generalized anxiety disorder: Secondary | ICD-10-CM | POA: Diagnosis not present

## 2020-08-20 DIAGNOSIS — Z79891 Long term (current) use of opiate analgesic: Secondary | ICD-10-CM | POA: Diagnosis not present

## 2020-08-20 DIAGNOSIS — F331 Major depressive disorder, recurrent, moderate: Secondary | ICD-10-CM | POA: Diagnosis not present

## 2020-09-02 DIAGNOSIS — F902 Attention-deficit hyperactivity disorder, combined type: Secondary | ICD-10-CM | POA: Diagnosis not present

## 2020-09-02 DIAGNOSIS — F411 Generalized anxiety disorder: Secondary | ICD-10-CM | POA: Diagnosis not present

## 2020-09-16 DIAGNOSIS — F331 Major depressive disorder, recurrent, moderate: Secondary | ICD-10-CM | POA: Diagnosis not present

## 2020-09-16 DIAGNOSIS — F411 Generalized anxiety disorder: Secondary | ICD-10-CM | POA: Diagnosis not present

## 2020-09-16 DIAGNOSIS — F1121 Opioid dependence, in remission: Secondary | ICD-10-CM | POA: Diagnosis not present

## 2020-09-16 DIAGNOSIS — Z79891 Long term (current) use of opiate analgesic: Secondary | ICD-10-CM | POA: Diagnosis not present

## 2020-09-16 DIAGNOSIS — Z6820 Body mass index (BMI) 20.0-20.9, adult: Secondary | ICD-10-CM | POA: Diagnosis not present

## 2020-10-14 DIAGNOSIS — F331 Major depressive disorder, recurrent, moderate: Secondary | ICD-10-CM | POA: Diagnosis not present

## 2020-10-14 DIAGNOSIS — F411 Generalized anxiety disorder: Secondary | ICD-10-CM | POA: Diagnosis not present

## 2020-10-14 DIAGNOSIS — F1121 Opioid dependence, in remission: Secondary | ICD-10-CM | POA: Diagnosis not present

## 2020-11-11 DIAGNOSIS — F411 Generalized anxiety disorder: Secondary | ICD-10-CM | POA: Diagnosis not present

## 2020-11-11 DIAGNOSIS — Z79891 Long term (current) use of opiate analgesic: Secondary | ICD-10-CM | POA: Diagnosis not present

## 2020-11-11 DIAGNOSIS — Z6820 Body mass index (BMI) 20.0-20.9, adult: Secondary | ICD-10-CM | POA: Diagnosis not present

## 2020-11-11 DIAGNOSIS — F1121 Opioid dependence, in remission: Secondary | ICD-10-CM | POA: Diagnosis not present

## 2020-11-11 DIAGNOSIS — F331 Major depressive disorder, recurrent, moderate: Secondary | ICD-10-CM | POA: Diagnosis not present

## 2020-12-09 DIAGNOSIS — F411 Generalized anxiety disorder: Secondary | ICD-10-CM | POA: Diagnosis not present

## 2020-12-09 DIAGNOSIS — F902 Attention-deficit hyperactivity disorder, combined type: Secondary | ICD-10-CM | POA: Diagnosis not present

## 2020-12-10 DIAGNOSIS — F331 Major depressive disorder, recurrent, moderate: Secondary | ICD-10-CM | POA: Diagnosis not present

## 2020-12-10 DIAGNOSIS — F1120 Opioid dependence, uncomplicated: Secondary | ICD-10-CM | POA: Diagnosis not present

## 2020-12-10 DIAGNOSIS — Z79891 Long term (current) use of opiate analgesic: Secondary | ICD-10-CM | POA: Diagnosis not present

## 2020-12-10 DIAGNOSIS — F411 Generalized anxiety disorder: Secondary | ICD-10-CM | POA: Diagnosis not present

## 2020-12-31 DIAGNOSIS — F1120 Opioid dependence, uncomplicated: Secondary | ICD-10-CM | POA: Diagnosis not present

## 2020-12-31 DIAGNOSIS — F411 Generalized anxiety disorder: Secondary | ICD-10-CM | POA: Diagnosis not present

## 2020-12-31 DIAGNOSIS — F331 Major depressive disorder, recurrent, moderate: Secondary | ICD-10-CM | POA: Diagnosis not present

## 2020-12-31 DIAGNOSIS — Z79891 Long term (current) use of opiate analgesic: Secondary | ICD-10-CM | POA: Diagnosis not present

## 2021-02-09 DIAGNOSIS — F331 Major depressive disorder, recurrent, moderate: Secondary | ICD-10-CM | POA: Diagnosis not present

## 2021-02-09 DIAGNOSIS — Z79891 Long term (current) use of opiate analgesic: Secondary | ICD-10-CM | POA: Diagnosis not present

## 2021-02-09 DIAGNOSIS — F411 Generalized anxiety disorder: Secondary | ICD-10-CM | POA: Diagnosis not present

## 2021-02-09 DIAGNOSIS — F1120 Opioid dependence, uncomplicated: Secondary | ICD-10-CM | POA: Diagnosis not present

## 2021-03-10 DIAGNOSIS — F331 Major depressive disorder, recurrent, moderate: Secondary | ICD-10-CM | POA: Diagnosis not present

## 2021-03-10 DIAGNOSIS — F411 Generalized anxiety disorder: Secondary | ICD-10-CM | POA: Diagnosis not present

## 2021-03-10 DIAGNOSIS — Z79891 Long term (current) use of opiate analgesic: Secondary | ICD-10-CM | POA: Diagnosis not present

## 2021-03-10 DIAGNOSIS — F1120 Opioid dependence, uncomplicated: Secondary | ICD-10-CM | POA: Diagnosis not present

## 2021-03-10 DIAGNOSIS — Z6822 Body mass index (BMI) 22.0-22.9, adult: Secondary | ICD-10-CM | POA: Diagnosis not present

## 2021-04-01 DIAGNOSIS — M67432 Ganglion, left wrist: Secondary | ICD-10-CM | POA: Diagnosis not present

## 2021-04-02 DIAGNOSIS — M25562 Pain in left knee: Secondary | ICD-10-CM | POA: Diagnosis not present

## 2021-04-02 DIAGNOSIS — M67432 Ganglion, left wrist: Secondary | ICD-10-CM | POA: Diagnosis not present

## 2021-04-07 DIAGNOSIS — F411 Generalized anxiety disorder: Secondary | ICD-10-CM | POA: Diagnosis not present

## 2021-04-07 DIAGNOSIS — Z79891 Long term (current) use of opiate analgesic: Secondary | ICD-10-CM | POA: Diagnosis not present

## 2021-04-07 DIAGNOSIS — F1120 Opioid dependence, uncomplicated: Secondary | ICD-10-CM | POA: Diagnosis not present

## 2021-04-07 DIAGNOSIS — F331 Major depressive disorder, recurrent, moderate: Secondary | ICD-10-CM | POA: Diagnosis not present

## 2021-04-22 DIAGNOSIS — F909 Attention-deficit hyperactivity disorder, unspecified type: Secondary | ICD-10-CM | POA: Diagnosis not present

## 2021-04-22 DIAGNOSIS — Z79899 Other long term (current) drug therapy: Secondary | ICD-10-CM | POA: Diagnosis not present

## 2021-04-22 DIAGNOSIS — G8929 Other chronic pain: Secondary | ICD-10-CM | POA: Diagnosis not present

## 2021-04-22 DIAGNOSIS — Q796 Ehlers-Danlos syndrome, unspecified: Secondary | ICD-10-CM | POA: Diagnosis not present

## 2021-04-22 DIAGNOSIS — Z0000 Encounter for general adult medical examination without abnormal findings: Secondary | ICD-10-CM | POA: Diagnosis not present

## 2021-05-05 DIAGNOSIS — Z79891 Long term (current) use of opiate analgesic: Secondary | ICD-10-CM | POA: Diagnosis not present

## 2021-05-05 DIAGNOSIS — F411 Generalized anxiety disorder: Secondary | ICD-10-CM | POA: Diagnosis not present

## 2021-05-05 DIAGNOSIS — F331 Major depressive disorder, recurrent, moderate: Secondary | ICD-10-CM | POA: Diagnosis not present

## 2021-05-05 DIAGNOSIS — F1120 Opioid dependence, uncomplicated: Secondary | ICD-10-CM | POA: Diagnosis not present

## 2021-05-06 DIAGNOSIS — R102 Pelvic and perineal pain: Secondary | ICD-10-CM | POA: Diagnosis not present

## 2021-05-06 DIAGNOSIS — N939 Abnormal uterine and vaginal bleeding, unspecified: Secondary | ICD-10-CM | POA: Diagnosis not present

## 2021-05-07 DIAGNOSIS — R002 Palpitations: Secondary | ICD-10-CM | POA: Diagnosis not present

## 2021-05-07 DIAGNOSIS — R000 Tachycardia, unspecified: Secondary | ICD-10-CM | POA: Diagnosis not present

## 2021-05-11 ENCOUNTER — Other Ambulatory Visit: Payer: Self-pay | Admitting: Nurse Practitioner

## 2021-05-11 DIAGNOSIS — R1031 Right lower quadrant pain: Secondary | ICD-10-CM

## 2021-05-12 ENCOUNTER — Ambulatory Visit: Payer: Federal, State, Local not specified - PPO | Admitting: Family Medicine

## 2021-05-26 DIAGNOSIS — R002 Palpitations: Secondary | ICD-10-CM | POA: Diagnosis not present

## 2021-06-02 DIAGNOSIS — F902 Attention-deficit hyperactivity disorder, combined type: Secondary | ICD-10-CM | POA: Diagnosis not present

## 2021-06-02 DIAGNOSIS — F1120 Opioid dependence, uncomplicated: Secondary | ICD-10-CM | POA: Diagnosis not present

## 2021-06-02 DIAGNOSIS — F331 Major depressive disorder, recurrent, moderate: Secondary | ICD-10-CM | POA: Diagnosis not present

## 2021-06-02 DIAGNOSIS — F411 Generalized anxiety disorder: Secondary | ICD-10-CM | POA: Diagnosis not present

## 2021-06-04 ENCOUNTER — Other Ambulatory Visit: Payer: Federal, State, Local not specified - PPO

## 2021-06-30 DIAGNOSIS — Z79891 Long term (current) use of opiate analgesic: Secondary | ICD-10-CM | POA: Diagnosis not present

## 2021-06-30 DIAGNOSIS — F411 Generalized anxiety disorder: Secondary | ICD-10-CM | POA: Diagnosis not present

## 2021-06-30 DIAGNOSIS — Z6822 Body mass index (BMI) 22.0-22.9, adult: Secondary | ICD-10-CM | POA: Diagnosis not present

## 2021-06-30 DIAGNOSIS — F1120 Opioid dependence, uncomplicated: Secondary | ICD-10-CM | POA: Diagnosis not present

## 2021-06-30 DIAGNOSIS — F331 Major depressive disorder, recurrent, moderate: Secondary | ICD-10-CM | POA: Diagnosis not present

## 2021-07-28 DIAGNOSIS — F902 Attention-deficit hyperactivity disorder, combined type: Secondary | ICD-10-CM | POA: Diagnosis not present

## 2021-07-28 DIAGNOSIS — F331 Major depressive disorder, recurrent, moderate: Secondary | ICD-10-CM | POA: Diagnosis not present

## 2021-07-28 DIAGNOSIS — F1120 Opioid dependence, uncomplicated: Secondary | ICD-10-CM | POA: Diagnosis not present

## 2021-07-28 DIAGNOSIS — F411 Generalized anxiety disorder: Secondary | ICD-10-CM | POA: Diagnosis not present

## 2021-08-17 ENCOUNTER — Emergency Department (HOSPITAL_BASED_OUTPATIENT_CLINIC_OR_DEPARTMENT_OTHER)
Admission: EM | Admit: 2021-08-17 | Discharge: 2021-08-17 | Disposition: A | Discharge status: Home / Self Care | Payer: Federal, State, Local not specified - PPO | Attending: Emergency Medicine | Admitting: Emergency Medicine

## 2021-08-17 ENCOUNTER — Encounter (HOSPITAL_BASED_OUTPATIENT_CLINIC_OR_DEPARTMENT_OTHER): Payer: Self-pay

## 2021-08-17 DIAGNOSIS — R112 Nausea with vomiting, unspecified: Secondary | ICD-10-CM | POA: Diagnosis not present

## 2021-08-17 DIAGNOSIS — H538 Other visual disturbances: Secondary | ICD-10-CM | POA: Insufficient documentation

## 2021-08-17 DIAGNOSIS — R002 Palpitations: Secondary | ICD-10-CM | POA: Diagnosis not present

## 2021-08-17 DIAGNOSIS — R9431 Abnormal electrocardiogram [ECG] [EKG]: Secondary | ICD-10-CM | POA: Diagnosis not present

## 2021-08-17 DIAGNOSIS — R55 Syncope and collapse: Secondary | ICD-10-CM | POA: Diagnosis not present

## 2021-08-17 DIAGNOSIS — G4489 Other headache syndrome: Secondary | ICD-10-CM | POA: Diagnosis not present

## 2021-08-17 DIAGNOSIS — R519 Headache, unspecified: Secondary | ICD-10-CM | POA: Insufficient documentation

## 2021-08-17 DIAGNOSIS — T679XXA Effect of heat and light, unspecified, initial encounter: Secondary | ICD-10-CM | POA: Diagnosis not present

## 2021-08-17 DIAGNOSIS — R42 Dizziness and giddiness: Secondary | ICD-10-CM | POA: Diagnosis not present

## 2021-08-17 LAB — COMPREHENSIVE METABOLIC PANEL
ALT: 11 U/L (ref 0–44)
AST: 21 U/L (ref 15–41)
Albumin: 3.8 g/dL (ref 3.5–5.0)
Alkaline Phosphatase: 52 U/L (ref 38–126)
Anion gap: 7 (ref 5–15)
BUN: 13 mg/dL (ref 6–20)
CO2: 22 mmol/L (ref 22–32)
Calcium: 9.4 mg/dL (ref 8.9–10.3)
Chloride: 109 mmol/L (ref 98–111)
Creatinine, Ser: 0.77 mg/dL (ref 0.44–1.00)
GFR, Estimated: >60 mL/min (ref >60)
Glucose, Bld: 120 mg/dL — ABNORMAL HIGH (ref 70–99)
Potassium: 3.6 mmol/L (ref 3.5–5.1)
Sodium: 138 mmol/L (ref 135–145)
Total Bilirubin: 0.5 mg/dL (ref 0.3–1.2)
Total Protein: 6.8 g/dL (ref 6.5–8.1)

## 2021-08-17 LAB — CBC
HCT: 35.8 % — ABNORMAL LOW (ref 36.0–46.0)
Hemoglobin: 12.3 g/dL (ref 12.0–15.0)
MCH: 32.1 pg (ref 26.0–34.0)
MCHC: 34.4 g/dL (ref 30.0–36.0)
MCV: 93.5 fL (ref 80.0–100.0)
Platelets: 231 10*3/uL (ref 150–400)
RBC: 3.83 MIL/uL — ABNORMAL LOW (ref 3.87–5.11)
RDW: 11.6 % (ref 11.5–15.5)
WBC: 7.3 10*3/uL (ref 4.0–10.5)
nRBC: 0 % (ref 0.0–0.2)

## 2021-08-17 LAB — CBG MONITORING, ED: Glucose-Capillary: 96 mg/dL (ref 70–99)

## 2021-08-17 MED ORDER — METOCLOPRAMIDE HCL 10 MG PO TABS: 10.0000 mg | ORAL_TABLET | Freq: Four times a day (QID) | ORAL | 0 refills | Status: AC

## 2021-08-17 MED ORDER — DIPHENHYDRAMINE HCL 25 MG PO TABS: 25.0000 mg | ORAL_TABLET | Freq: Four times a day (QID) | ORAL | 0 refills | Status: AC | PRN

## 2021-08-17 MED ORDER — DIPHENHYDRAMINE HCL 25 MG PO CAPS
25.0000 mg | ORAL_CAPSULE | Freq: Once | ORAL | Status: AC
  Administered 2021-08-17: 25 mg via ORAL
  Filled 2021-08-17: qty 1

## 2021-08-17 MED ORDER — METOCLOPRAMIDE HCL 5 MG/ML IJ SOLN
10.0000 mg | Freq: Once | INTRAMUSCULAR | Status: AC
  Administered 2021-08-17: 10 mg via INTRAVENOUS
  Filled 2021-08-17: qty 2

## 2021-08-17 MED ORDER — LACTATED RINGERS IV BOLUS
700.0000 mL | Freq: Once | INTRAVENOUS | Status: AC
Start: 2021-08-17 — End: 2021-08-17
  Administered 2021-08-17: 700 mL via INTRAVENOUS
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
## 2021-08-17 NOTE — ED Notes (Signed)
Pt NAD, a/ox4. Pt verbalizes understanding of all DC and f/u instructions. All questions answered. Pt walks with steady gait to lobby at DC.  ? ?

## 2021-08-17 NOTE — Discharge Instructions (Signed)
You may use Reglan as needed for nausea/headaches. ? ?Follow up with you primary care provider and cardiologist.  ? ? ?
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
## 2021-08-25 DIAGNOSIS — Z6822 Body mass index (BMI) 22.0-22.9, adult: Secondary | ICD-10-CM | POA: Diagnosis not present

## 2021-08-25 DIAGNOSIS — F1120 Opioid dependence, uncomplicated: Secondary | ICD-10-CM | POA: Diagnosis not present

## 2021-08-25 DIAGNOSIS — F331 Major depressive disorder, recurrent, moderate: Secondary | ICD-10-CM | POA: Diagnosis not present

## 2021-08-25 DIAGNOSIS — F411 Generalized anxiety disorder: Secondary | ICD-10-CM | POA: Diagnosis not present

## 2021-09-22 DIAGNOSIS — F411 Generalized anxiety disorder: Secondary | ICD-10-CM | POA: Diagnosis not present

## 2021-09-22 DIAGNOSIS — F331 Major depressive disorder, recurrent, moderate: Secondary | ICD-10-CM | POA: Diagnosis not present

## 2021-09-22 DIAGNOSIS — Z6822 Body mass index (BMI) 22.0-22.9, adult: Secondary | ICD-10-CM | POA: Diagnosis not present

## 2021-09-22 DIAGNOSIS — F1120 Opioid dependence, uncomplicated: Secondary | ICD-10-CM | POA: Diagnosis not present

## 2021-10-01 DIAGNOSIS — F411 Generalized anxiety disorder: Secondary | ICD-10-CM | POA: Diagnosis not present

## 2021-10-01 DIAGNOSIS — F902 Attention-deficit hyperactivity disorder, combined type: Secondary | ICD-10-CM | POA: Diagnosis not present

## 2021-10-20 DIAGNOSIS — Z79891 Long term (current) use of opiate analgesic: Secondary | ICD-10-CM | POA: Diagnosis not present

## 2021-10-20 DIAGNOSIS — F411 Generalized anxiety disorder: Secondary | ICD-10-CM | POA: Diagnosis not present

## 2021-10-20 DIAGNOSIS — F331 Major depressive disorder, recurrent, moderate: Secondary | ICD-10-CM | POA: Diagnosis not present

## 2021-10-20 DIAGNOSIS — F1120 Opioid dependence, uncomplicated: Secondary | ICD-10-CM | POA: Diagnosis not present

## 2021-11-17 DIAGNOSIS — L821 Other seborrheic keratosis: Secondary | ICD-10-CM | POA: Diagnosis not present

## 2021-11-17 DIAGNOSIS — D485 Neoplasm of uncertain behavior of skin: Secondary | ICD-10-CM | POA: Diagnosis not present

## 2021-11-17 DIAGNOSIS — F331 Major depressive disorder, recurrent, moderate: Secondary | ICD-10-CM | POA: Diagnosis not present

## 2021-11-17 DIAGNOSIS — D225 Melanocytic nevi of trunk: Secondary | ICD-10-CM | POA: Diagnosis not present

## 2021-11-17 DIAGNOSIS — Z6822 Body mass index (BMI) 22.0-22.9, adult: Secondary | ICD-10-CM | POA: Diagnosis not present

## 2021-11-17 DIAGNOSIS — L570 Actinic keratosis: Secondary | ICD-10-CM | POA: Diagnosis not present

## 2021-11-17 DIAGNOSIS — F411 Generalized anxiety disorder: Secondary | ICD-10-CM | POA: Diagnosis not present

## 2021-11-17 DIAGNOSIS — F1120 Opioid dependence, uncomplicated: Secondary | ICD-10-CM | POA: Diagnosis not present

## 2021-11-29 DIAGNOSIS — M67432 Ganglion, left wrist: Secondary | ICD-10-CM | POA: Diagnosis not present

## 2021-11-30 DIAGNOSIS — R300 Dysuria: Secondary | ICD-10-CM | POA: Diagnosis not present

## 2021-11-30 DIAGNOSIS — Z6822 Body mass index (BMI) 22.0-22.9, adult: Secondary | ICD-10-CM | POA: Diagnosis not present

## 2021-11-30 DIAGNOSIS — F902 Attention-deficit hyperactivity disorder, combined type: Secondary | ICD-10-CM | POA: Diagnosis not present

## 2021-11-30 DIAGNOSIS — N3090 Cystitis, unspecified without hematuria: Secondary | ICD-10-CM | POA: Diagnosis not present

## 2021-12-02 DIAGNOSIS — M67432 Ganglion, left wrist: Secondary | ICD-10-CM | POA: Diagnosis not present

## 2021-12-07 ENCOUNTER — Telehealth: Payer: Self-pay

## 2021-12-07 NOTE — Patient Outreach (Signed)
  Care Coordination   12/07/2021 Name: Cynthia Brooks MRN: [id number] DOB: 06/25/1980   Care Coordination Outreach Attempts:  An unsuccessful telephone outreach was attempted today to offer the patient information about available care coordination services as a benefit of their health plan.   Follow Up Plan:  Additional outreach attempts will be made to offer the patient care coordination information and services.   Encounter Outcome:  No Answer  Care Coordination Interventions Activated:  No   Care Coordination Interventions:  No, not indicated    Rowe Pavy, RN, BSN, CEN [facility] [employer] [phone]

## 2021-12-14 DIAGNOSIS — F1120 Opioid dependence, uncomplicated: Secondary | ICD-10-CM | POA: Diagnosis not present

## 2021-12-14 DIAGNOSIS — Z6822 Body mass index (BMI) 22.0-22.9, adult: Secondary | ICD-10-CM | POA: Diagnosis not present

## 2021-12-14 DIAGNOSIS — F411 Generalized anxiety disorder: Secondary | ICD-10-CM | POA: Diagnosis not present

## 2021-12-14 DIAGNOSIS — F331 Major depressive disorder, recurrent, moderate: Secondary | ICD-10-CM | POA: Diagnosis not present

## 2021-12-24 DIAGNOSIS — Z0000 Encounter for general adult medical examination without abnormal findings: Secondary | ICD-10-CM | POA: Diagnosis not present

## 2021-12-24 DIAGNOSIS — F1910 Other psychoactive substance abuse, uncomplicated: Secondary | ICD-10-CM | POA: Diagnosis not present

## 2021-12-24 DIAGNOSIS — Z6822 Body mass index (BMI) 22.0-22.9, adult: Secondary | ICD-10-CM | POA: Diagnosis not present

## 2022-01-11 ENCOUNTER — Ambulatory Visit: Payer: Self-pay | Admitting: Licensed Clinical Social Worker

## 2022-01-11 DIAGNOSIS — Z6823 Body mass index (BMI) 23.0-23.9, adult: Secondary | ICD-10-CM | POA: Diagnosis not present

## 2022-01-11 DIAGNOSIS — Z79891 Long term (current) use of opiate analgesic: Secondary | ICD-10-CM | POA: Diagnosis not present

## 2022-01-11 DIAGNOSIS — F411 Generalized anxiety disorder: Secondary | ICD-10-CM | POA: Diagnosis not present

## 2022-01-11 DIAGNOSIS — F1120 Opioid dependence, uncomplicated: Secondary | ICD-10-CM | POA: Diagnosis not present

## 2022-01-11 DIAGNOSIS — F331 Major depressive disorder, recurrent, moderate: Secondary | ICD-10-CM | POA: Diagnosis not present

## 2022-01-11 NOTE — Patient Outreach (Signed)
  Care Coordination   Initial Visit Note   01/11/2022 Name: Cynthia Brooks MRN: [id number] DOB: Nov 23, 1980  Cynthia Brooks is a 41 y.o. year old female who sees Valinda Party, MD for primary care.  Goals Addressed             This Visit's Progress    Care Coordination Activiites- Unsuccesful outreach       SW reviewed chart in attempts to prepare for call. Three attempts have been made to patient. All attempts unsuccessful. No additional calls will be made.         SDOH assessments and interventions completed:  No     Care Coordination Interventions Activated:  No  Care Coordination Interventions:  No, not indicated   Follow up plan: No further intervention required.   Encounter Outcome:  No Answer

## 2022-02-04 DIAGNOSIS — F411 Generalized anxiety disorder: Secondary | ICD-10-CM | POA: Diagnosis not present

## 2022-02-04 DIAGNOSIS — F1120 Opioid dependence, uncomplicated: Secondary | ICD-10-CM | POA: Diagnosis not present

## 2022-02-04 DIAGNOSIS — Z6822 Body mass index (BMI) 22.0-22.9, adult: Secondary | ICD-10-CM | POA: Diagnosis not present

## 2022-02-04 DIAGNOSIS — F331 Major depressive disorder, recurrent, moderate: Secondary | ICD-10-CM | POA: Diagnosis not present

## 2022-03-14 DIAGNOSIS — F411 Generalized anxiety disorder: Secondary | ICD-10-CM | POA: Diagnosis not present

## 2022-03-14 DIAGNOSIS — F902 Attention-deficit hyperactivity disorder, combined type: Secondary | ICD-10-CM | POA: Diagnosis not present

## 2022-03-14 DIAGNOSIS — Z6822 Body mass index (BMI) 22.0-22.9, adult: Secondary | ICD-10-CM | POA: Diagnosis not present

## 2022-03-22 DIAGNOSIS — F411 Generalized anxiety disorder: Secondary | ICD-10-CM | POA: Diagnosis not present

## 2022-03-22 DIAGNOSIS — Z79891 Long term (current) use of opiate analgesic: Secondary | ICD-10-CM | POA: Diagnosis not present

## 2022-03-22 DIAGNOSIS — F331 Major depressive disorder, recurrent, moderate: Secondary | ICD-10-CM | POA: Diagnosis not present

## 2022-03-22 DIAGNOSIS — F1120 Opioid dependence, uncomplicated: Secondary | ICD-10-CM | POA: Diagnosis not present

## 2022-03-22 DIAGNOSIS — Z6823 Body mass index (BMI) 23.0-23.9, adult: Secondary | ICD-10-CM | POA: Diagnosis not present

## 2022-04-15 DIAGNOSIS — F411 Generalized anxiety disorder: Secondary | ICD-10-CM | POA: Diagnosis not present

## 2022-04-15 DIAGNOSIS — F331 Major depressive disorder, recurrent, moderate: Secondary | ICD-10-CM | POA: Diagnosis not present

## 2022-04-15 DIAGNOSIS — F1120 Opioid dependence, uncomplicated: Secondary | ICD-10-CM | POA: Diagnosis not present

## 2022-04-15 DIAGNOSIS — Z6822 Body mass index (BMI) 22.0-22.9, adult: Secondary | ICD-10-CM | POA: Diagnosis not present

## 2022-05-11 DIAGNOSIS — F411 Generalized anxiety disorder: Secondary | ICD-10-CM | POA: Diagnosis not present

## 2022-05-11 DIAGNOSIS — F902 Attention-deficit hyperactivity disorder, combined type: Secondary | ICD-10-CM | POA: Diagnosis not present

## 2022-06-08 DIAGNOSIS — F331 Major depressive disorder, recurrent, moderate: Secondary | ICD-10-CM | POA: Diagnosis not present

## 2022-06-08 DIAGNOSIS — F1120 Opioid dependence, uncomplicated: Secondary | ICD-10-CM | POA: Diagnosis not present

## 2022-06-08 DIAGNOSIS — Z6823 Body mass index (BMI) 23.0-23.9, adult: Secondary | ICD-10-CM | POA: Diagnosis not present

## 2022-06-08 DIAGNOSIS — F411 Generalized anxiety disorder: Secondary | ICD-10-CM | POA: Diagnosis not present

## 2022-06-08 DIAGNOSIS — Z79891 Long term (current) use of opiate analgesic: Secondary | ICD-10-CM | POA: Diagnosis not present

## 2022-06-30 DIAGNOSIS — F1120 Opioid dependence, uncomplicated: Secondary | ICD-10-CM | POA: Diagnosis not present

## 2022-06-30 DIAGNOSIS — F331 Major depressive disorder, recurrent, moderate: Secondary | ICD-10-CM | POA: Diagnosis not present

## 2022-06-30 DIAGNOSIS — F411 Generalized anxiety disorder: Secondary | ICD-10-CM | POA: Diagnosis not present

## 2022-07-12 DIAGNOSIS — F411 Generalized anxiety disorder: Secondary | ICD-10-CM | POA: Diagnosis not present

## 2022-07-12 DIAGNOSIS — F902 Attention-deficit hyperactivity disorder, combined type: Secondary | ICD-10-CM | POA: Diagnosis not present

## 2022-07-12 DIAGNOSIS — I471 Supraventricular tachycardia, unspecified: Secondary | ICD-10-CM | POA: Diagnosis not present

## 2022-07-18 ENCOUNTER — Encounter: Payer: Self-pay | Admitting: *Deleted

## 2022-07-28 DIAGNOSIS — F1120 Opioid dependence, uncomplicated: Secondary | ICD-10-CM | POA: Diagnosis not present

## 2022-07-28 DIAGNOSIS — F411 Generalized anxiety disorder: Secondary | ICD-10-CM | POA: Diagnosis not present

## 2022-07-28 DIAGNOSIS — F331 Major depressive disorder, recurrent, moderate: Secondary | ICD-10-CM | POA: Diagnosis not present

## 2022-08-24 DIAGNOSIS — F1120 Opioid dependence, uncomplicated: Secondary | ICD-10-CM | POA: Diagnosis not present

## 2022-08-24 DIAGNOSIS — F411 Generalized anxiety disorder: Secondary | ICD-10-CM | POA: Diagnosis not present

## 2022-08-24 DIAGNOSIS — F331 Major depressive disorder, recurrent, moderate: Secondary | ICD-10-CM | POA: Diagnosis not present

## 2022-09-21 DIAGNOSIS — Z79891 Long term (current) use of opiate analgesic: Secondary | ICD-10-CM | POA: Diagnosis not present

## 2022-09-21 DIAGNOSIS — F1120 Opioid dependence, uncomplicated: Secondary | ICD-10-CM | POA: Diagnosis not present

## 2022-09-21 DIAGNOSIS — Z6821 Body mass index (BMI) 21.0-21.9, adult: Secondary | ICD-10-CM | POA: Diagnosis not present

## 2022-09-21 DIAGNOSIS — F411 Generalized anxiety disorder: Secondary | ICD-10-CM | POA: Diagnosis not present

## 2022-09-21 DIAGNOSIS — F331 Major depressive disorder, recurrent, moderate: Secondary | ICD-10-CM | POA: Diagnosis not present

## 2022-10-10 DIAGNOSIS — Z6821 Body mass index (BMI) 21.0-21.9, adult: Secondary | ICD-10-CM | POA: Diagnosis not present

## 2022-10-10 DIAGNOSIS — F411 Generalized anxiety disorder: Secondary | ICD-10-CM | POA: Diagnosis not present

## 2022-10-10 DIAGNOSIS — M791 Myalgia, unspecified site: Secondary | ICD-10-CM | POA: Diagnosis not present

## 2022-10-10 DIAGNOSIS — F902 Attention-deficit hyperactivity disorder, combined type: Secondary | ICD-10-CM | POA: Diagnosis not present

## 2022-10-25 DIAGNOSIS — F1120 Opioid dependence, uncomplicated: Secondary | ICD-10-CM | POA: Diagnosis not present

## 2022-10-25 DIAGNOSIS — F331 Major depressive disorder, recurrent, moderate: Secondary | ICD-10-CM | POA: Diagnosis not present

## 2022-10-25 DIAGNOSIS — F411 Generalized anxiety disorder: Secondary | ICD-10-CM | POA: Diagnosis not present

## 2022-11-17 DIAGNOSIS — F331 Major depressive disorder, recurrent, moderate: Secondary | ICD-10-CM | POA: Diagnosis not present

## 2022-11-17 DIAGNOSIS — F411 Generalized anxiety disorder: Secondary | ICD-10-CM | POA: Diagnosis not present

## 2022-11-17 DIAGNOSIS — F1120 Opioid dependence, uncomplicated: Secondary | ICD-10-CM | POA: Diagnosis not present

## 2022-12-15 DIAGNOSIS — F411 Generalized anxiety disorder: Secondary | ICD-10-CM | POA: Diagnosis not present

## 2022-12-15 DIAGNOSIS — F1120 Opioid dependence, uncomplicated: Secondary | ICD-10-CM | POA: Diagnosis not present

## 2022-12-15 DIAGNOSIS — F331 Major depressive disorder, recurrent, moderate: Secondary | ICD-10-CM | POA: Diagnosis not present

## 2023-01-05 DIAGNOSIS — F902 Attention-deficit hyperactivity disorder, combined type: Secondary | ICD-10-CM | POA: Diagnosis not present

## 2023-01-05 DIAGNOSIS — F411 Generalized anxiety disorder: Secondary | ICD-10-CM | POA: Diagnosis not present

## 2023-01-05 DIAGNOSIS — Z6822 Body mass index (BMI) 22.0-22.9, adult: Secondary | ICD-10-CM | POA: Diagnosis not present

## 2023-01-12 DIAGNOSIS — F411 Generalized anxiety disorder: Secondary | ICD-10-CM | POA: Diagnosis not present

## 2023-01-12 DIAGNOSIS — F331 Major depressive disorder, recurrent, moderate: Secondary | ICD-10-CM | POA: Diagnosis not present

## 2023-01-12 DIAGNOSIS — Z6821 Body mass index (BMI) 21.0-21.9, adult: Secondary | ICD-10-CM | POA: Diagnosis not present

## 2023-01-12 DIAGNOSIS — F1120 Opioid dependence, uncomplicated: Secondary | ICD-10-CM | POA: Diagnosis not present

## 2023-02-09 DIAGNOSIS — F1120 Opioid dependence, uncomplicated: Secondary | ICD-10-CM | POA: Diagnosis not present

## 2023-02-09 DIAGNOSIS — F331 Major depressive disorder, recurrent, moderate: Secondary | ICD-10-CM | POA: Diagnosis not present

## 2023-02-09 DIAGNOSIS — F411 Generalized anxiety disorder: Secondary | ICD-10-CM | POA: Diagnosis not present

## 2023-03-15 DIAGNOSIS — F411 Generalized anxiety disorder: Secondary | ICD-10-CM | POA: Diagnosis not present

## 2023-03-15 DIAGNOSIS — F331 Major depressive disorder, recurrent, moderate: Secondary | ICD-10-CM | POA: Diagnosis not present

## 2023-03-15 DIAGNOSIS — F1120 Opioid dependence, uncomplicated: Secondary | ICD-10-CM | POA: Diagnosis not present

## 2023-04-12 DIAGNOSIS — Z79891 Long term (current) use of opiate analgesic: Secondary | ICD-10-CM | POA: Diagnosis not present

## 2023-04-12 DIAGNOSIS — F331 Major depressive disorder, recurrent, moderate: Secondary | ICD-10-CM | POA: Diagnosis not present

## 2023-04-12 DIAGNOSIS — Z6821 Body mass index (BMI) 21.0-21.9, adult: Secondary | ICD-10-CM | POA: Diagnosis not present

## 2023-04-12 DIAGNOSIS — F1120 Opioid dependence, uncomplicated: Secondary | ICD-10-CM | POA: Diagnosis not present

## 2023-04-12 DIAGNOSIS — F411 Generalized anxiety disorder: Secondary | ICD-10-CM | POA: Diagnosis not present

## 2023-04-13 DIAGNOSIS — F411 Generalized anxiety disorder: Secondary | ICD-10-CM | POA: Diagnosis not present

## 2023-04-13 DIAGNOSIS — F902 Attention-deficit hyperactivity disorder, combined type: Secondary | ICD-10-CM | POA: Diagnosis not present

## 2023-04-13 DIAGNOSIS — Z6822 Body mass index (BMI) 22.0-22.9, adult: Secondary | ICD-10-CM | POA: Diagnosis not present

## 2023-05-10 DIAGNOSIS — F411 Generalized anxiety disorder: Secondary | ICD-10-CM | POA: Diagnosis not present

## 2023-05-10 DIAGNOSIS — F331 Major depressive disorder, recurrent, moderate: Secondary | ICD-10-CM | POA: Diagnosis not present

## 2023-05-10 DIAGNOSIS — F1120 Opioid dependence, uncomplicated: Secondary | ICD-10-CM | POA: Diagnosis not present

## 2023-06-07 DIAGNOSIS — Z79891 Long term (current) use of opiate analgesic: Secondary | ICD-10-CM | POA: Diagnosis not present

## 2023-06-07 DIAGNOSIS — Z6823 Body mass index (BMI) 23.0-23.9, adult: Secondary | ICD-10-CM | POA: Diagnosis not present

## 2023-06-07 DIAGNOSIS — F331 Major depressive disorder, recurrent, moderate: Secondary | ICD-10-CM | POA: Diagnosis not present

## 2023-06-07 DIAGNOSIS — F411 Generalized anxiety disorder: Secondary | ICD-10-CM | POA: Diagnosis not present

## 2023-06-07 DIAGNOSIS — F1120 Opioid dependence, uncomplicated: Secondary | ICD-10-CM | POA: Diagnosis not present

## 2023-07-05 DIAGNOSIS — F331 Major depressive disorder, recurrent, moderate: Secondary | ICD-10-CM | POA: Diagnosis not present

## 2023-07-05 DIAGNOSIS — F411 Generalized anxiety disorder: Secondary | ICD-10-CM | POA: Diagnosis not present

## 2023-07-05 DIAGNOSIS — F1120 Opioid dependence, uncomplicated: Secondary | ICD-10-CM | POA: Diagnosis not present

## 2023-07-06 DIAGNOSIS — Z6822 Body mass index (BMI) 22.0-22.9, adult: Secondary | ICD-10-CM | POA: Diagnosis not present

## 2023-07-06 DIAGNOSIS — F411 Generalized anxiety disorder: Secondary | ICD-10-CM | POA: Diagnosis not present

## 2023-07-06 DIAGNOSIS — M129 Arthropathy, unspecified: Secondary | ICD-10-CM | POA: Diagnosis not present

## 2023-07-06 DIAGNOSIS — F902 Attention-deficit hyperactivity disorder, combined type: Secondary | ICD-10-CM | POA: Diagnosis not present

## 2023-08-02 DIAGNOSIS — F331 Major depressive disorder, recurrent, moderate: Secondary | ICD-10-CM | POA: Diagnosis not present

## 2023-08-02 DIAGNOSIS — Z6823 Body mass index (BMI) 23.0-23.9, adult: Secondary | ICD-10-CM | POA: Diagnosis not present

## 2023-08-02 DIAGNOSIS — F411 Generalized anxiety disorder: Secondary | ICD-10-CM | POA: Diagnosis not present

## 2023-08-02 DIAGNOSIS — Z79891 Long term (current) use of opiate analgesic: Secondary | ICD-10-CM | POA: Diagnosis not present

## 2023-08-02 DIAGNOSIS — F1120 Opioid dependence, uncomplicated: Secondary | ICD-10-CM | POA: Diagnosis not present

## 2023-08-16 DIAGNOSIS — M79642 Pain in left hand: Secondary | ICD-10-CM | POA: Diagnosis not present

## 2023-08-16 DIAGNOSIS — M79671 Pain in right foot: Secondary | ICD-10-CM | POA: Diagnosis not present

## 2023-08-16 DIAGNOSIS — M549 Dorsalgia, unspecified: Secondary | ICD-10-CM | POA: Diagnosis not present

## 2023-08-16 DIAGNOSIS — M79641 Pain in right hand: Secondary | ICD-10-CM | POA: Diagnosis not present

## 2023-08-16 DIAGNOSIS — M25561 Pain in right knee: Secondary | ICD-10-CM | POA: Diagnosis not present

## 2023-08-16 DIAGNOSIS — M79643 Pain in unspecified hand: Secondary | ICD-10-CM | POA: Diagnosis not present

## 2023-08-16 DIAGNOSIS — M1990 Unspecified osteoarthritis, unspecified site: Secondary | ICD-10-CM | POA: Diagnosis not present

## 2023-08-16 DIAGNOSIS — M79672 Pain in left foot: Secondary | ICD-10-CM | POA: Diagnosis not present

## 2023-08-16 DIAGNOSIS — M2550 Pain in unspecified joint: Secondary | ICD-10-CM | POA: Diagnosis not present

## 2023-08-16 DIAGNOSIS — M791 Myalgia, unspecified site: Secondary | ICD-10-CM | POA: Diagnosis not present

## 2023-08-16 DIAGNOSIS — M25562 Pain in left knee: Secondary | ICD-10-CM | POA: Diagnosis not present

## 2023-08-16 DIAGNOSIS — M25529 Pain in unspecified elbow: Secondary | ICD-10-CM | POA: Diagnosis not present

## 2023-09-27 DIAGNOSIS — F411 Generalized anxiety disorder: Secondary | ICD-10-CM | POA: Diagnosis not present

## 2023-09-27 DIAGNOSIS — F1120 Opioid dependence, uncomplicated: Secondary | ICD-10-CM | POA: Diagnosis not present

## 2023-09-27 DIAGNOSIS — F331 Major depressive disorder, recurrent, moderate: Secondary | ICD-10-CM | POA: Diagnosis not present

## 2023-10-19 DIAGNOSIS — Z6823 Body mass index (BMI) 23.0-23.9, adult: Secondary | ICD-10-CM | POA: Diagnosis not present

## 2023-10-19 DIAGNOSIS — F902 Attention-deficit hyperactivity disorder, combined type: Secondary | ICD-10-CM | POA: Diagnosis not present

## 2023-10-19 DIAGNOSIS — M129 Arthropathy, unspecified: Secondary | ICD-10-CM | POA: Diagnosis not present

## 2023-10-19 DIAGNOSIS — F411 Generalized anxiety disorder: Secondary | ICD-10-CM | POA: Diagnosis not present

## 2023-10-29 ENCOUNTER — Ambulatory Visit (HOSPITAL_COMMUNITY): Admission: EM | Admit: 2023-10-29 | Discharge: 2023-10-29 | Disposition: A | Discharge status: Home / Self Care

## 2023-10-29 ENCOUNTER — Encounter (HOSPITAL_COMMUNITY): Payer: Self-pay | Admitting: *Deleted

## 2023-10-29 DIAGNOSIS — N3001 Acute cystitis with hematuria: Secondary | ICD-10-CM

## 2023-10-29 LAB — POCT URINALYSIS DIP (MANUAL ENTRY)
Bilirubin, UA: NEGATIVE
Glucose, UA: NEGATIVE mg/dL
Ketones, POC UA: NEGATIVE mg/dL
Nitrite, UA: NEGATIVE
Protein Ur, POC: 100 mg/dL — AB
Spec Grav, UA: 1.02 (ref 1.010–1.025)
Urobilinogen, UA: 1 U/dL
pH, UA: 8.5 — AB (ref 5.0–8.0)

## 2023-10-29 MED ORDER — CIPROFLOXACIN HCL 500 MG PO TABS: 500.0000 mg | ORAL_TABLET | Freq: Two times a day (BID) | ORAL | 0 refills | Status: AC

## 2023-10-29 NOTE — Discharge Instructions (Signed)
 Common causes of urinary tract infections include but are not limited to holding your urine longer than you should, squatting instead of sitting down when urinating, sitting around in wet clothing such as a wet swimsuit or gym clothes too long, not emptying your bladder after having sexual intercourse, wiping from back to front instead of front to back after having a bowel movement.     The urinalysis that we performed in the clinic today was abnormal.  Urine culture will be performed per our protocol.  The result of the urine culture will be available in the next 1 to 3 days and will be posted to your MyChart account.  If there is an abnormal finding, you will be contacted by phone and advised of further treatment recommendations, if any.   You were advised to begin antibiotics today because your urinalysis is abnormal and you are having active symptoms of an acute lower urinary tract infection also known as cystitis.     Please pick up and begin taking your prescription for Ciprofloxacin  500 mg as soon as possible.  Please take all doses exactly as prescribed.  You can take this medication with or without food.  This medication is safe to take with your other medications.  If you are feeling completely better after taking ciprofloxacin  twice daily for 5 days, you can discontinue.   If you receive a phone call advising you that your urine culture is negative but you begin to feel better after taking antibiotics for 24 hours, please feel free to complete the full course of antibiotics as they were likely needed and the urine culture result was false.  If your culture is negative and you do not feel any better, please reach out to your regular provider for further evaluation.  Thank you for visiting [facility] today.  We appreciate the opportunity to participate in your care.

## 2023-10-29 NOTE — ED Triage Notes (Signed)
 Pt states she has dysuria and blood in her urine since Friday but she started having more blood even clots. 

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
## 2023-11-22 DIAGNOSIS — F331 Major depressive disorder, recurrent, moderate: Secondary | ICD-10-CM | POA: Diagnosis not present

## 2023-11-22 DIAGNOSIS — F411 Generalized anxiety disorder: Secondary | ICD-10-CM | POA: Diagnosis not present

## 2023-11-22 DIAGNOSIS — F1120 Opioid dependence, uncomplicated: Secondary | ICD-10-CM | POA: Diagnosis not present

## 2023-12-20 DIAGNOSIS — F902 Attention-deficit hyperactivity disorder, combined type: Secondary | ICD-10-CM | POA: Diagnosis not present

## 2023-12-20 DIAGNOSIS — Z6823 Body mass index (BMI) 23.0-23.9, adult: Secondary | ICD-10-CM | POA: Diagnosis not present

## 2023-12-20 DIAGNOSIS — Z79899 Other long term (current) drug therapy: Secondary | ICD-10-CM | POA: Diagnosis not present

## 2023-12-20 DIAGNOSIS — F411 Generalized anxiety disorder: Secondary | ICD-10-CM | POA: Diagnosis not present

## 2023-12-20 DIAGNOSIS — Z13220 Encounter for screening for lipoid disorders: Secondary | ICD-10-CM | POA: Diagnosis not present

## 2023-12-20 DIAGNOSIS — C44701 Unspecified malignant neoplasm of skin of unspecified lower limb, including hip: Secondary | ICD-10-CM | POA: Diagnosis not present

## 2023-12-21 DIAGNOSIS — F331 Major depressive disorder, recurrent, moderate: Secondary | ICD-10-CM | POA: Diagnosis not present

## 2023-12-21 DIAGNOSIS — F1120 Opioid dependence, uncomplicated: Secondary | ICD-10-CM | POA: Diagnosis not present

## 2023-12-21 DIAGNOSIS — F411 Generalized anxiety disorder: Secondary | ICD-10-CM | POA: Diagnosis not present

## 2024-02-15 DIAGNOSIS — F331 Major depressive disorder, recurrent, moderate: Secondary | ICD-10-CM | POA: Diagnosis not present

## 2024-02-15 DIAGNOSIS — F1120 Opioid dependence, uncomplicated: Secondary | ICD-10-CM | POA: Diagnosis not present

## 2024-02-15 DIAGNOSIS — F411 Generalized anxiety disorder: Secondary | ICD-10-CM | POA: Diagnosis not present

## 2024-03-11 DIAGNOSIS — F411 Generalized anxiety disorder: Secondary | ICD-10-CM | POA: Diagnosis not present

## 2024-03-11 DIAGNOSIS — Z6823 Body mass index (BMI) 23.0-23.9, adult: Secondary | ICD-10-CM | POA: Diagnosis not present

## 2024-03-11 DIAGNOSIS — F902 Attention-deficit hyperactivity disorder, combined type: Secondary | ICD-10-CM | POA: Diagnosis not present
